# Patient Record
Sex: Female | Born: 1960 | Race: White | Hispanic: No | Marital: Married | Smoking: Never smoker
Health system: Southern US, Community
[De-identification: ages and names within clinical notes are randomized; demographics above are authoritative.]

## PROBLEM LIST (undated history)

## (undated) DIAGNOSIS — E079 Disorder of thyroid, unspecified: Secondary | ICD-10-CM

## (undated) DIAGNOSIS — N12 Tubulo-interstitial nephritis, not specified as acute or chronic: Secondary | ICD-10-CM

## (undated) DIAGNOSIS — L659 Nonscarring hair loss, unspecified: Secondary | ICD-10-CM

## (undated) DIAGNOSIS — E349 Endocrine disorder, unspecified: Secondary | ICD-10-CM

## (undated) DIAGNOSIS — Z1371 Encounter for nonprocreative screening for genetic disease carrier status: Secondary | ICD-10-CM

## (undated) DIAGNOSIS — D219 Benign neoplasm of connective and other soft tissue, unspecified: Secondary | ICD-10-CM

## (undated) DIAGNOSIS — K589 Irritable bowel syndrome without diarrhea: Secondary | ICD-10-CM

## (undated) DIAGNOSIS — N912 Amenorrhea, unspecified: Secondary | ICD-10-CM

## (undated) DIAGNOSIS — F419 Anxiety disorder, unspecified: Secondary | ICD-10-CM

## (undated) HISTORY — DX: Benign neoplasm of connective and other soft tissue, unspecified: D21.9

## (undated) HISTORY — DX: Anxiety disorder, unspecified: F41.9

## (undated) HISTORY — PX: OPEN REDUCTION INTERNAL FIXATION (ORIF) CALCANEAL FRACTURE WITH FUSION: SHX5697

## (undated) HISTORY — DX: Irritable bowel syndrome, unspecified: K58.9

## (undated) HISTORY — DX: Amenorrhea, unspecified: N91.2

## (undated) HISTORY — DX: Disorder of thyroid, unspecified: E07.9

## (undated) HISTORY — DX: Endocrine disorder, unspecified: E34.9

## (undated) HISTORY — DX: Nonscarring hair loss, unspecified: L65.9

## (undated) HISTORY — DX: Tubulo-interstitial nephritis, not specified as acute or chronic: N12

## (undated) HISTORY — DX: Encounter for nonprocreative screening for genetic disease carrier status: Z13.71

---

## 1998-08-25 ENCOUNTER — Inpatient Hospital Stay (HOSPITAL_COMMUNITY): Admission: AD | Admit: 1998-08-25 | Discharge: 1998-08-29 | Payer: Self-pay | Admitting: *Deleted

## 1999-01-17 ENCOUNTER — Other Ambulatory Visit: Admission: RE | Admit: 1999-01-17 | Discharge: 1999-01-17 | Payer: Self-pay | Admitting: *Deleted

## 2000-04-13 ENCOUNTER — Encounter: Payer: Self-pay | Admitting: *Deleted

## 2000-04-13 ENCOUNTER — Ambulatory Visit (HOSPITAL_COMMUNITY): Admission: RE | Admit: 2000-04-13 | Discharge: 2000-04-13 | Payer: Self-pay | Admitting: *Deleted

## 2000-09-14 ENCOUNTER — Inpatient Hospital Stay (HOSPITAL_COMMUNITY): Admission: AD | Admit: 2000-09-14 | Discharge: 2000-09-17 | Payer: Self-pay | Admitting: *Deleted

## 2003-01-26 ENCOUNTER — Other Ambulatory Visit: Admission: RE | Admit: 2003-01-26 | Discharge: 2003-01-26 | Payer: Self-pay | Admitting: Obstetrics and Gynecology

## 2005-10-26 ENCOUNTER — Emergency Department: Payer: Self-pay | Admitting: Emergency Medicine

## 2006-04-23 ENCOUNTER — Emergency Department: Payer: Self-pay | Admitting: Unknown Physician Specialty

## 2006-04-23 ENCOUNTER — Other Ambulatory Visit: Payer: Self-pay

## 2006-04-24 ENCOUNTER — Ambulatory Visit: Payer: Self-pay | Admitting: Internal Medicine

## 2009-02-05 ENCOUNTER — Emergency Department: Payer: Self-pay | Admitting: Emergency Medicine

## 2010-05-31 ENCOUNTER — Ambulatory Visit: Payer: Self-pay | Admitting: Family Medicine

## 2010-06-01 ENCOUNTER — Inpatient Hospital Stay: Payer: Self-pay | Admitting: Internal Medicine

## 2011-12-26 HISTORY — PX: COLONOSCOPY: SHX174

## 2012-05-24 ENCOUNTER — Ambulatory Visit: Payer: Self-pay | Admitting: Unknown Physician Specialty

## 2012-12-25 DIAGNOSIS — Z1371 Encounter for nonprocreative screening for genetic disease carrier status: Secondary | ICD-10-CM

## 2012-12-25 HISTORY — DX: Encounter for nonprocreative screening for genetic disease carrier status: Z13.71

## 2013-01-09 ENCOUNTER — Ambulatory Visit: Payer: Self-pay | Admitting: Family Medicine

## 2013-01-13 ENCOUNTER — Ambulatory Visit: Payer: Self-pay | Admitting: Family Medicine

## 2013-03-06 IMAGING — US THYROID ULTRASOUND
1 series · 14 of 25 positions shown · non-contrast
Comparison: none

REASON FOR EXAM: rt sided fullness hyperthyroidism
COMMENTS:

[Series 1: thyroid ultrasound · 0.07mm/px · 14 of 45 slices shown]
[im 1/45]
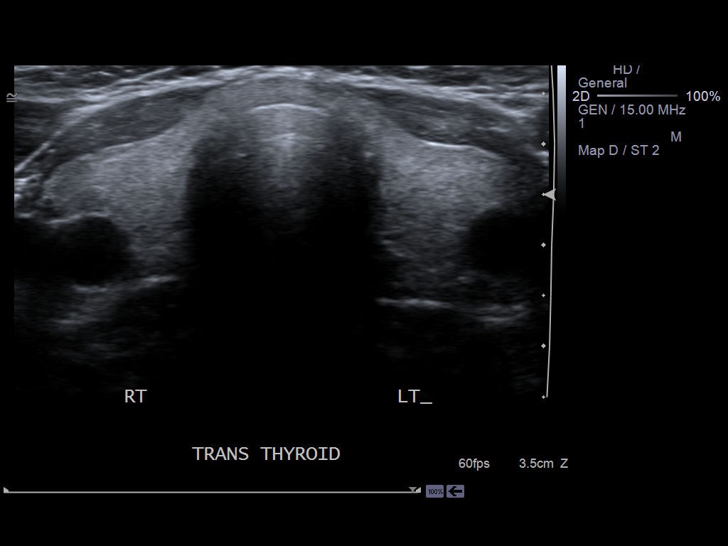
[im 4/45]
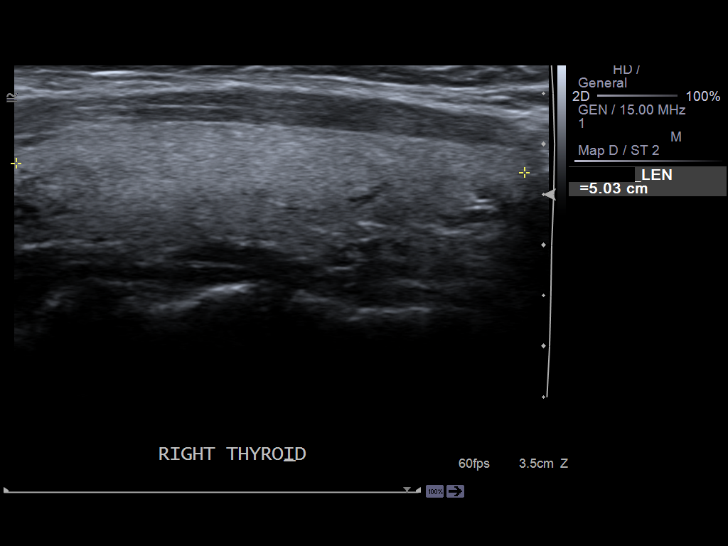
[im 8/45]
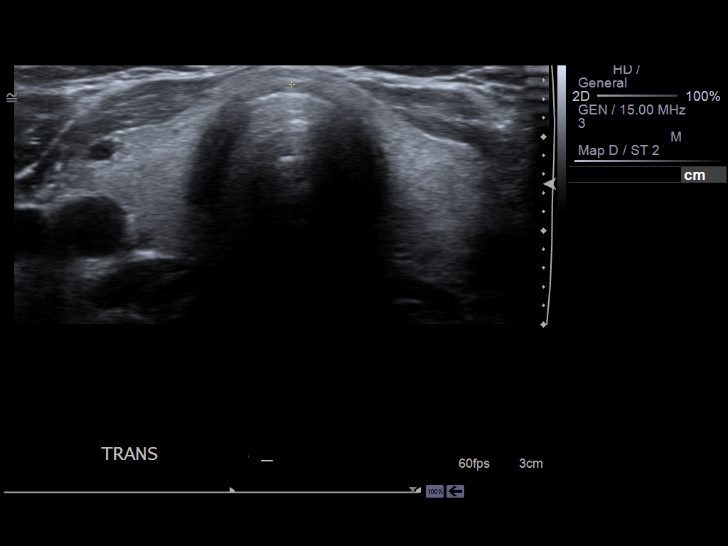
[im 12/45]
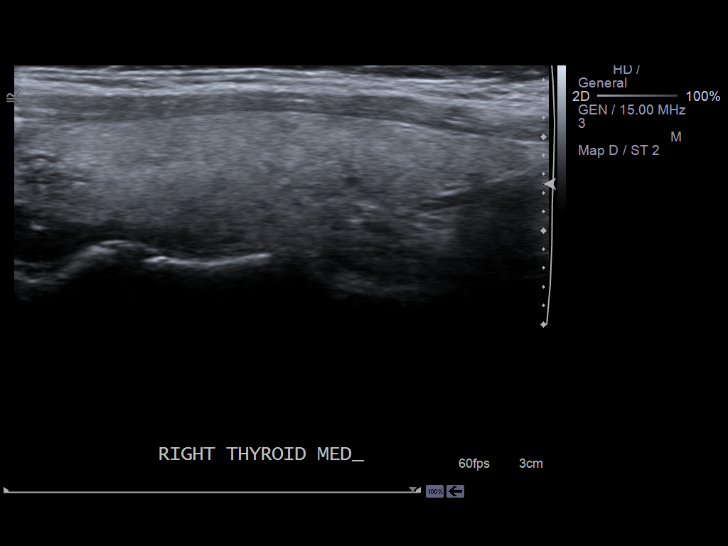
[im 15/45]
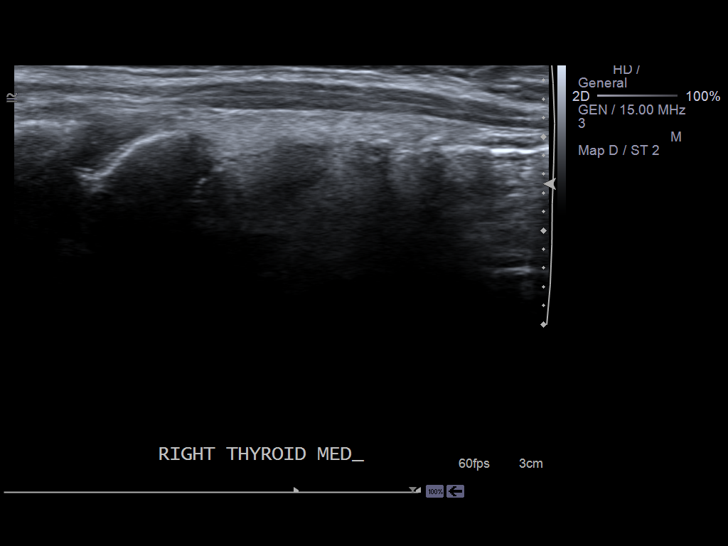
[im 17/45]
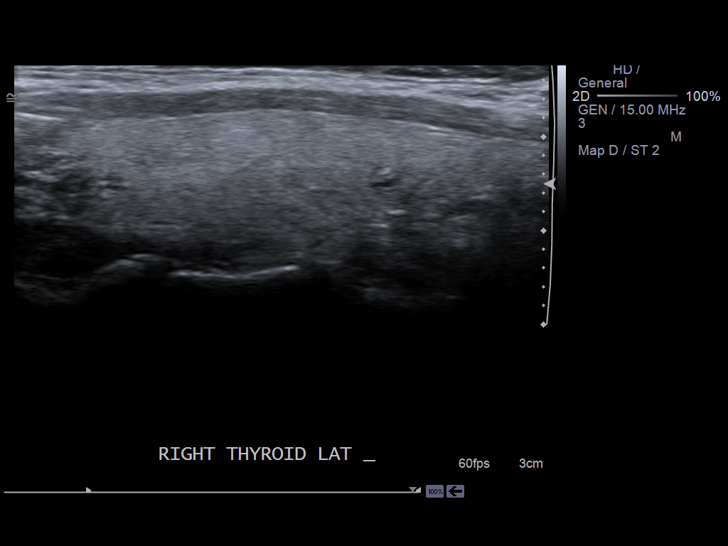
[im 21/45]
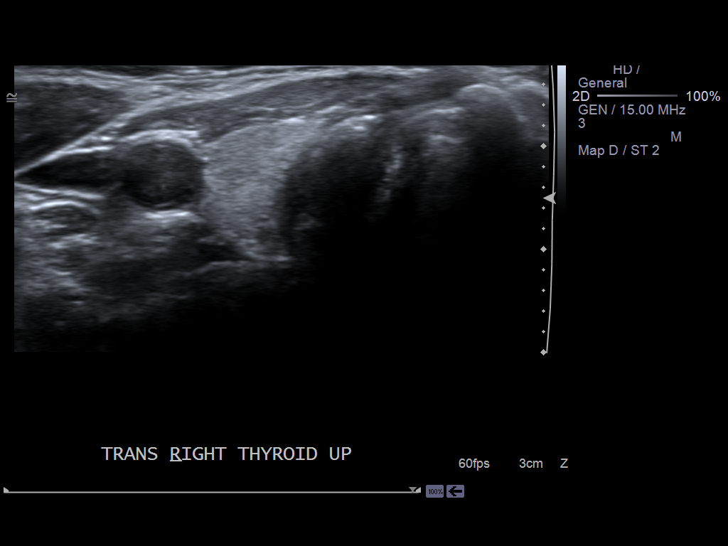
[im 24/45]
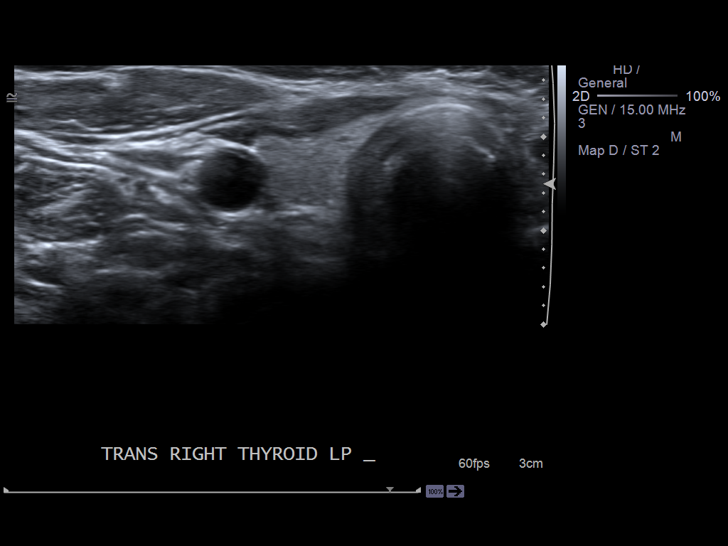
[im 28/45]
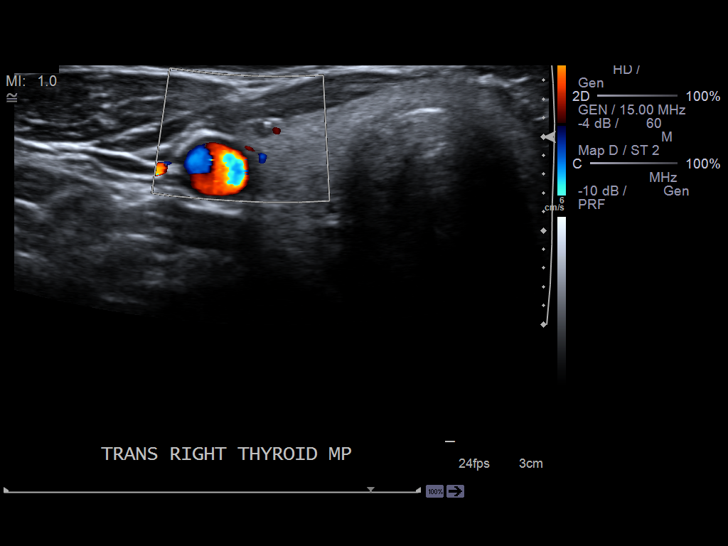
[im 30/45]
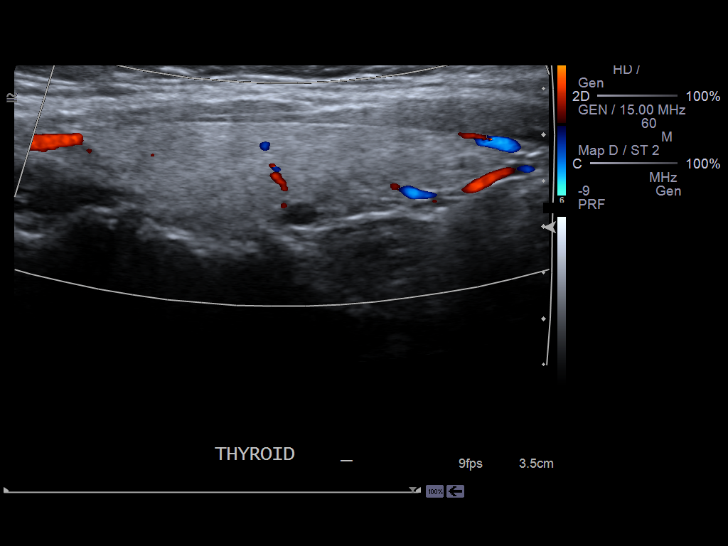
[im 34/45]
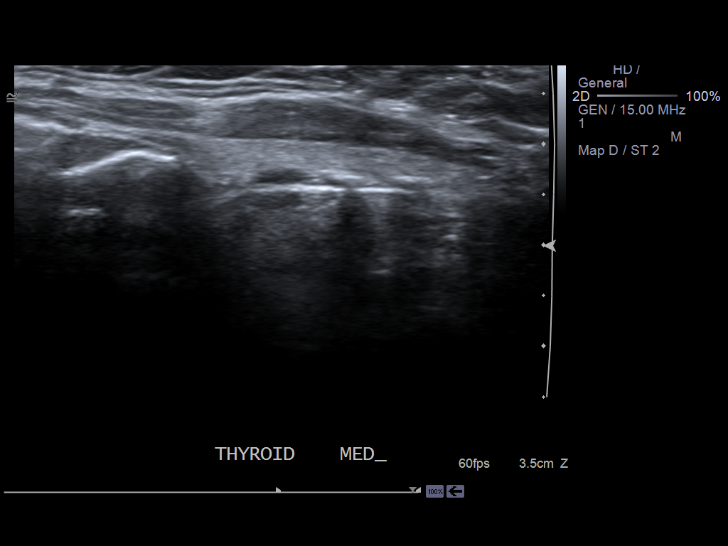
[im 37/45]
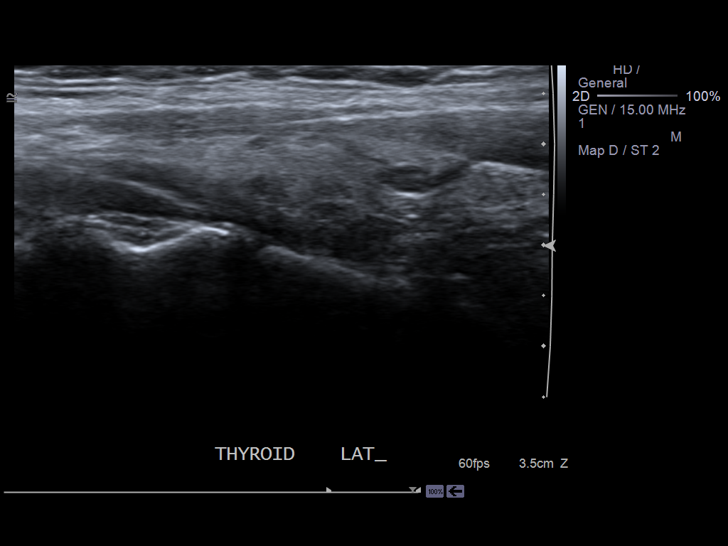
[im 41/45]
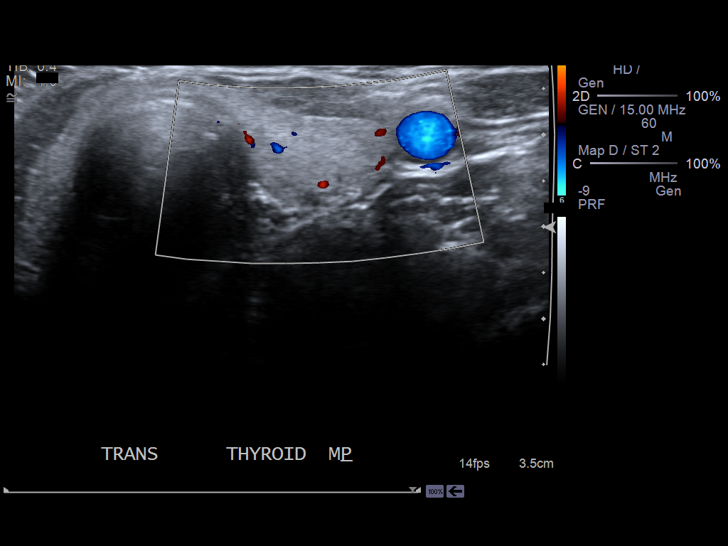
[im 45/45]
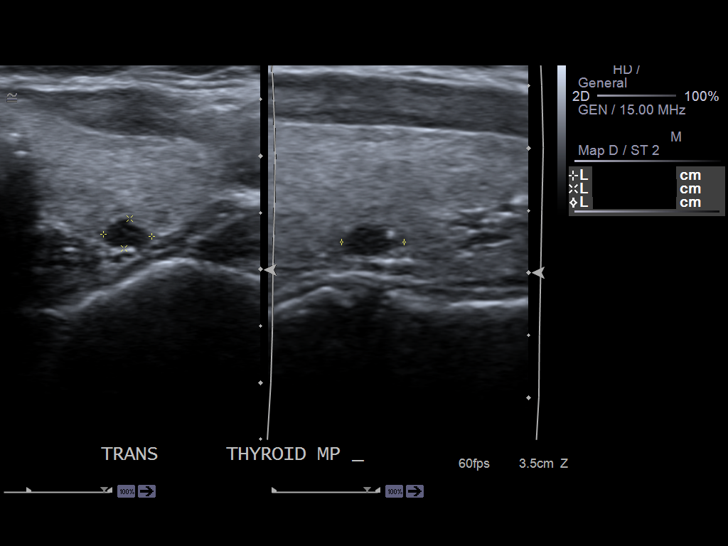

[14 of 25 positions shown; findings below may reference images not displayed]

PROCEDURE:     DARIUS THEODORE - DARIUS THEODORE SOFT TISSUE HEAD/NECK/THYROI  - January 13, 2013  [DATE]

RESULT:     The right thyroid lobe measures 5.03 x 1.32 x 1.31 cm. The left
thyroid lobe measures 4.64 x 1.54 x 1.22 cm. The isthmus shows an anterior
to posterior thickness of 0.24 cm. Within the mid right thyroid lobe there
is a hypoechoic solid appearing mass of 0.5 x 0.34 x 0.2 or centimeters.
Within the left thyroid lobe midpole region posteriorly there is a
hypoechoic 0.43 x 0.27 x 0.50 cm nodule. The hypo and anechoic structures
show some focal hyper echogenicity suggestive of areas of minimal
calcification.
IMPRESSION: Hypoechoic lesions with calcification in both thyroid
lobes. Borderline thyroid enlargement on the right.

[REDACTED]

## 2014-08-26 ENCOUNTER — Emergency Department: Payer: Self-pay | Admitting: Emergency Medicine

## 2014-08-26 LAB — CBC WITH DIFFERENTIAL/PLATELET
Basophil #: 0 10*3/uL (ref 0.0–0.1)
Basophil %: 0.4 %
Eosinophil #: 0 10*3/uL (ref 0.0–0.7)
Eosinophil %: 0.2 %
HCT: 40.3 % (ref 35.0–47.0)
HGB: 13.2 g/dL (ref 12.0–16.0)
Lymphocyte #: 0.9 10*3/uL — ABNORMAL LOW (ref 1.0–3.6)
Lymphocyte %: 13.2 %
MCH: 31.3 pg (ref 26.0–34.0)
MCHC: 32.7 g/dL (ref 32.0–36.0)
MCV: 96 fL (ref 80–100)
Monocyte #: 0.3 x10 3/mm (ref 0.2–0.9)
Monocyte %: 4.3 %
Neutrophil #: 5.9 10*3/uL (ref 1.4–6.5)
Neutrophil %: 81.9 %
Platelet: 215 10*3/uL (ref 150–440)
RBC: 4.22 10*6/uL (ref 3.80–5.20)
RDW: 12.2 % (ref 11.5–14.5)
WBC: 7.2 10*3/uL (ref 3.6–11.0)

## 2014-08-26 LAB — BASIC METABOLIC PANEL
Anion Gap: 6 — ABNORMAL LOW (ref 7–16)
BUN: 11 mg/dL (ref 7–18)
Calcium, Total: 8.7 mg/dL (ref 8.5–10.1)
Chloride: 105 mmol/L (ref 98–107)
Co2: 27 mmol/L (ref 21–32)
Creatinine: 0.7 mg/dL (ref 0.60–1.30)
EGFR (African American): >60
EGFR (Non-African Amer.): >60
Glucose: 136 mg/dL — ABNORMAL HIGH (ref 65–99)
Osmolality: 277 (ref 275–301)
Potassium: 3.8 mmol/L (ref 3.5–5.1)
Sodium: 138 mmol/L (ref 136–145)

## 2014-08-26 LAB — URINALYSIS, COMPLETE
Bacteria: NONE SEEN
Bilirubin,UR: NEGATIVE
Blood: NEGATIVE
Glucose,UR: NEGATIVE mg/dL (ref 0–75)
Ketone: NEGATIVE
Leukocyte Esterase: NEGATIVE
Nitrite: NEGATIVE
Ph: 6 (ref 4.5–8.0)
Protein: NEGATIVE
RBC,UR: NONE SEEN /HPF (ref 0–5)
Specific Gravity: 1.005 (ref 1.003–1.030)
Squamous Epithelial: <1
WBC UR: <1 /HPF (ref 0–5)

## 2014-08-26 LAB — T4, FREE: Free Thyroxine: 0.99 ng/dL (ref 0.76–1.46)

## 2014-08-26 LAB — TROPONIN I: Troponin-I: <0.02 ng/mL

## 2014-12-28 DIAGNOSIS — E039 Hypothyroidism, unspecified: Secondary | ICD-10-CM | POA: Insufficient documentation

## 2015-12-31 DIAGNOSIS — E038 Other specified hypothyroidism: Secondary | ICD-10-CM | POA: Insufficient documentation

## 2015-12-31 DIAGNOSIS — E063 Autoimmune thyroiditis: Secondary | ICD-10-CM | POA: Insufficient documentation

## 2015-12-31 DIAGNOSIS — E042 Nontoxic multinodular goiter: Secondary | ICD-10-CM | POA: Insufficient documentation

## 2016-03-05 ENCOUNTER — Emergency Department
Admission: EM | Admit: 2016-03-05 | Discharge: 2016-03-05 | Disposition: A | Discharge status: Home / Self Care | Payer: BLUE CROSS/BLUE SHIELD | Attending: Emergency Medicine | Admitting: Emergency Medicine

## 2016-03-05 DIAGNOSIS — R509 Fever, unspecified: Secondary | ICD-10-CM | POA: Diagnosis present

## 2016-03-05 DIAGNOSIS — B349 Viral infection, unspecified: Secondary | ICD-10-CM | POA: Insufficient documentation

## 2016-03-05 MED ORDER — ACETAMINOPHEN-CODEINE 120-12 MG/5ML PO SOLN: 2.5000 mL | Freq: Once | ORAL | Status: DC

## 2016-03-05 MED ORDER — SULFAMETHOXAZOLE-TRIMETHOPRIM 200-40 MG/5ML PO SUSP: 5.0000 mL | Freq: Once | ORAL | Status: DC

## 2016-03-05 MED ORDER — PSEUDOEPH-BROMPHEN-DM 30-2-10 MG/5ML PO SYRP
5.0000 mL | ORAL_SOLUTION | Freq: Four times a day (QID) | ORAL | Status: DC | PRN
Start: 2016-03-05 — End: 2018-06-13

## 2016-03-05 NOTE — Discharge Instructions (Signed)
Continue taking Tylenol for fever and body aches. Viral Infections A virus is a type of germ. Viruses can cause:  Minor sore throats.  Aches and pains.  Headaches.  Runny nose.  Rashes.  Watery eyes.  Tiredness.  Coughs.  Loss of appetite.  Feeling sick to your stomach (nausea).  Throwing up (vomiting).  Watery poop (diarrhea). HOME CARE   Only take medicines as told by your doctor.  Drink enough water and fluids to keep your pee (urine) clear or pale yellow. Sports drinks are a good choice.  Get plenty of rest and eat healthy. Soups and broths with crackers or rice are fine. GET HELP RIGHT AWAY IF:   You have a very bad headache.  You have shortness of breath.  You have chest pain or neck pain.  You have an unusual rash.  You cannot stop throwing up.  You have watery poop that does not stop.  You cannot keep fluids down.  You or your child has a temperature by mouth above 102 F (38.9 C), not controlled by medicine.  Your baby is older than 3 months with a rectal temperature of 102 F (38.9 C) or higher.  Your baby is 63 months old or younger with a rectal temperature of 100.4 F (38 C) or higher. MAKE SURE YOU:   Understand these instructions.  Will watch this condition.  Will get help right away if you are not doing well or get worse.   This information is not intended to replace advice given to you by your health care provider. Make sure you discuss any questions you have with your health care provider.   Document Released: 11/23/2008 Document Revised: 03/04/2012 Document Reviewed: 05/19/2015 Elsevier Interactive Patient Education [employer].

## 2016-03-05 NOTE — ED Notes (Signed)
Believes may have flu d/t feeling of "creepy crawly skin", chills, muscle pain in neck, pain in eyes. Nonfebrile at this time, with stable vs

## 2016-03-05 NOTE — ED Notes (Signed)
Pt states that she has had a fever the past few days, states temp increased today, states that her skin feels "creepy crawly", pt reports eye pain. Denies dysuria, reports sepsis in the past and wanted to make sure she was evaluated before she became that sick

## 2016-03-05 NOTE — ED Provider Notes (Signed)
[facility] Emergency Department Provider Note  ____________________________________________  Time seen: Approximately 10:45 PM  I have reviewed the triage vital signs and the nursing notes.   HISTORY  Chief Complaint Fever    HPI Kelly Ortiz is a 55 y.o. female patient believes she has the flu. Patient states she's having creepy crawly skin, chills, muscle aches, and pain behind her eyes. Patient state this complaint for the last 5 days. Patient states she was being evaluated because this was same symptoms she had in the past which she became septic and had hospital admission. Patient states she's had the flu shot this season. Patient denies any nausea vomiting diarrhea. Patient state taking Tylenol for body aches. Patient rates the pain as a 5/10. Patient describes the pain as "achy".   No past medical history on file.  There are no active problems to display for this patient.   No past surgical history on file.  Current Outpatient Rx  Name  Route  Sig  Dispense  Refill  . brompheniramine-pseudoephedrine-DM 30-2-10 MG/5ML syrup   Oral   Take 5 mLs by mouth 4 (four) times daily as needed.   120 mL   0     Allergies Iodine  No family history on file.  Social History Social History  Substance Use Topics  . Smoking status: Not on file  . Smokeless tobacco: Not on file  . Alcohol Use: Not on file    Review of Systems Constitutional: States fevers chills Eyes: No visual changes. ENT: No sore throat. Cardiovascular: Denies chest pain. Respiratory: Denies shortness of breath. Gastrointestinal: No abdominal pain.  No nausea, no vomiting.  No diarrhea.  No constipation. Genitourinary: Negative for dysuria. Musculoskeletal: Negative for back pain. Skin: Negative for rash. Neurological: Negative for headaches, focal weakness or numbness. 10-point ROS otherwise negative.  ____________________________________________   PHYSICAL  EXAM:  VITAL SIGNS: ED Triage Vitals  Enc Vitals Group     BP 03/05/16 2154 142/67 mmHg     Pulse Rate 03/05/16 2154 100     Resp 03/05/16 2154 18     Temp 03/05/16 2154 99.2 F (37.3 C)     Temp Source 03/05/16 2154 Oral     SpO2 03/05/16 2154 99 %     Weight 03/05/16 2154 129 lb (58.514 kg)     Height 03/05/16 2154 5\' 3"  (1.6 m)     Head Cir --      Peak Flow --      Pain Score 03/05/16 2155 5     Pain Loc --      Pain Edu? --      Excl. in [place]? --     Constitutional: Alert and oriented. Well appearing and in no acute distress. Eyes: Conjunctivae are normal. PERRL. EOMI. Head: Atraumatic. Nose:  congestion/rhinnorhea. Mouth/Throat: Mucous membranes are moist.  Oropharynx non-erythematous. Neck: No stridor.  No cervical spine tenderness to palpation. Hematological/Lymphatic/Immunilogical: No cervical lymphadenopathy. Cardiovascular: Normal rate, regular rhythm. Grossly normal heart sounds.  Good peripheral circulation. Respiratory: Normal respiratory effort.  No retractions. Lungs CTAB. Nonproductive cough Gastrointestinal: Soft and nontender. No distention. No abdominal bruits. No CVA tenderness. Musculoskeletal: No lower extremity tenderness nor edema.  No joint effusions. Neurologic:  Normal speech and language. No gross focal neurologic deficits are appreciated. No gait instability. Skin:  Skin is warm, dry and intact. No rash noted. Psychiatric: Mood and affect are normal. Speech and behavior are normal.  ____________________________________________   LABS (all labs ordered are  listed, but only abnormal results are displayed)  Labs Reviewed - No data to display ____________________________________________  EKG   ____________________________________________  RADIOLOGY   ____________________________________________   PROCEDURES  Procedure(s) performed: None  Critical Care performed: No  ____________________________________________   INITIAL  IMPRESSION / ASSESSMENT AND PLAN / ED COURSE  Pertinent labs & imaging results that were available during my care of the patient were reviewed by me and considered in my medical decision making (see chart for details).  Viral illness. Patient given discharge care instructions. Patient advised to follow-up with family doctor if no improvement in next 2-3 days. Patient given a prescription for Bromfed-DM and advised to continue Tylenol for pain. ____________________________________________   FINAL CLINICAL IMPRESSION(S) / ED DIAGNOSES  Final diagnoses:  Viral illness      Sable Feil, PA-C 03/05/16 2258  Delman Kitten, MD 03/06/16 [phone]

## 2017-05-04 ENCOUNTER — Ambulatory Visit: Payer: Self-pay | Admitting: Obstetrics and Gynecology

## 2017-05-09 ENCOUNTER — Ambulatory Visit (INDEPENDENT_AMBULATORY_CARE_PROVIDER_SITE_OTHER): Payer: BLUE CROSS/BLUE SHIELD | Admitting: Obstetrics and Gynecology

## 2017-05-09 ENCOUNTER — Encounter: Payer: Self-pay | Admitting: Obstetrics and Gynecology

## 2017-05-09 VITALS — BP 102/58 | HR 72 | Ht 63.0 in | Wt 136.0 lb

## 2017-05-09 DIAGNOSIS — Z01419 Encounter for gynecological examination (general) (routine) without abnormal findings: Secondary | ICD-10-CM | POA: Diagnosis not present

## 2017-05-09 DIAGNOSIS — Z1239 Encounter for other screening for malignant neoplasm of breast: Secondary | ICD-10-CM

## 2017-05-09 DIAGNOSIS — Z1231 Encounter for screening mammogram for malignant neoplasm of breast: Secondary | ICD-10-CM | POA: Diagnosis not present

## 2017-05-09 DIAGNOSIS — Z79890 Hormone replacement therapy (postmenopausal): Secondary | ICD-10-CM | POA: Diagnosis not present

## 2017-05-09 DIAGNOSIS — Z8041 Family history of malignant neoplasm of ovary: Secondary | ICD-10-CM

## 2017-05-09 MED ORDER — AMBULATORY NON FORMULARY MEDICATION: 1 refills | Status: DC

## 2017-05-09 MED ORDER — AMBULATORY NON FORMULARY MEDICATION: 12 refills | Status: DC

## 2017-05-09 NOTE — Progress Notes (Signed)
Chief Complaint  Patient presents with  . Gynecologic Exam    HPI:      [name] is a 56 y.o. No obstetric history on file. who LMP was No LMP recorded. Patient is postmenopausal., presents today for her annual examination.  Her menses are absent due to menopause. She does not have intermenstrual bleeding.  She does have occas vasomotor sx. She uses estradiol and progesterone with sx relief. She is on biest 3% 70/30 crm and prog 150 mg. She denies any side effects and wants to cont meds. Rx RF go to [place].  Sex activity: single partner, contraception - post menopausal status. She does not have vaginal dryness.  Last Pap: Apr 29, 2015  Results were: no abnormalities /neg HPV DNA.  Hx of STDs: none  Last mammogram: September 04, 2016  Results were: normal--routine follow-up in 12 months There is no FH of breast cancer. There is a FH of ovarian cancer in her mom. Pt was BRCA neg 2014 and My Risk neg 2017. Pt does yearly GYN u/s and ca-125. The patient does do self-breast exams.  Colonoscopy: colonoscopy 5 years ago without abnormalities. Repeat--10 yrs. DEXA with PCP 2017 was T-1.5 in spine.   Tobacco use: None Alcohol use: social drinker Exercise: moderately active  She does get adequate calcium and Vitamin D in her diet.  She has labs with PCP.  Past Medical History:  Diagnosis Date  . Alopecia   . Amenorrhea   . Anxiety   . BRCA negative   . Fibroid   . Hormone disorder   . IBS (irritable bowel syndrome)   . Pyelonephritis   . Thyroid disease     Past Surgical History:  Procedure Laterality Date  . CESAREAN SECTION    . COLONOSCOPY  2013  . OPEN REDUCTION INTERNAL FIXATION (ORIF) CALCANEAL FRACTURE WITH FUSION      Family History  Problem Relation Age of Onset  . Rheum arthritis Mother   . Osteoporosis Mother   . Ovarian cancer Mother   . Cancer Mother 73       ovarian/uterine   . Coronary artery disease Father   . Diabetes Father   .  Coronary artery disease Maternal Grandfather   . Hyperlipidemia Maternal Grandfather   . Coronary artery disease Paternal Grandmother   . Diabetes Paternal Grandmother   . Heart failure Paternal Grandfather     Social History   Social History  . Marital status: Married    Spouse name: N/A  . Number of children: N/A  . Years of education: N/A   Occupational History  . Not on file.   Social History Main Topics  . Smoking status: Never Smoker  . Smokeless tobacco: Never Used  . Alcohol use Yes     Comment: occaisonal  . Drug use: No  . Sexual activity: Yes    Birth control/ protection: Post-menopausal   Other Topics Concern  . Not on file   Social History Narrative  . No narrative on file     Current Outpatient Prescriptions:  .  Thyroid (LEVOTHYROXINE-LIOTHYRONINE) 60 MG TABS, Take by mouth., Disp: , Rfl:  .  AMBULATORY NON FORMULARY MEDICATION, Progesterone oral 150 mg Take 1 capsule daily 6 days on, 1 day off, Disp: 30 capsule, Rfl: 12 .  AMBULATORY NON FORMULARY MEDICATION, Biest 3% 70/30 cream Apply 0.1 ml daily, dispense 5 ml syringes, Disp: 30 mL, Rfl: 1 .  aspirin EC 81 MG tablet, Take by mouth.,  Disp: , Rfl:  .  B Complex Vitamins (VITAMIN-B COMPLEX) TABS, Take by mouth., Disp: , Rfl:  .  Biotin 2500 MCG CAPS, Take by mouth., Disp: , Rfl:  .  brompheniramine-pseudoephedrine-DM 30-2-10 MG/5ML syrup, Take 5 mLs by mouth 4 (four) times daily as needed., Disp: 120 mL, Rfl: 0 .  calcium citrate-vitamin D (CITRACAL+D) 315-200 MG-UNIT tablet, Take by mouth., Disp: , Rfl:  .  Cholecalciferol (VITAMIN D) 2000 units tablet, Take by mouth., Disp: , Rfl:  .  Garlic Oil (HM GARLIC) 448 MG TABS, Take by mouth., Disp: , Rfl:  .  GLUCOSAMINE-CHONDROITIN-CA-D3 PO, Take by mouth., Disp: , Rfl:  .  MAGNESIUM PO, Take by mouth., Disp: , Rfl:  .  Multiple Vitamin (MULTI-VITAMINS) TABS, Take by mouth., Disp: , Rfl:  .  Probiotic Product (PROBIOTIC-10 PO), Take by mouth., Disp: , Rfl:   .  Tavaborole 5 % SOLN, Apply topically once daily., Disp: , Rfl:  .  TURMERIC PO, Take by mouth., Disp: , Rfl:  .  vitamin C (ASCORBIC ACID) 500 MG tablet, Take by mouth., Disp: , Rfl:    ROS:  Review of Systems  Constitutional: Negative for fatigue, fever and unexpected weight change.  Respiratory: Negative for cough, shortness of breath and wheezing.   Cardiovascular: Negative for chest pain, palpitations and leg swelling.  Gastrointestinal: Positive for constipation. Negative for blood in stool, diarrhea, nausea and vomiting.  Endocrine: Negative for cold intolerance, heat intolerance and polyuria.  Genitourinary: Negative for dyspareunia, dysuria, flank pain, frequency, genital sores, hematuria, menstrual problem, pelvic pain, urgency, vaginal bleeding, vaginal discharge and vaginal pain.  Musculoskeletal: Negative for back pain, joint swelling and myalgias.  Skin: Negative for rash.  Neurological: Negative for dizziness, syncope, light-headedness, numbness and headaches.  Hematological: Negative for adenopathy.  Psychiatric/Behavioral: Negative for agitation, confusion, sleep disturbance and suicidal ideas. The patient is nervous/anxious.      Objective: BP (!) 102/58   Pulse 72   Ht 5' 3"  (1.6 m)   Wt 136 lb (61.7 kg)   BMI 24.09 kg/m    Physical Exam  Constitutional: She is oriented to person, place, and time. She appears well-developed and well-nourished.  Genitourinary: Vagina normal and uterus normal. There is no rash or tenderness on the right labia. There is no rash or tenderness on the left labia. No erythema or tenderness in the vagina. No vaginal discharge found. Right adnexum does not display mass and does not display tenderness. Left adnexum does not display mass and does not display tenderness. Cervix does not exhibit motion tenderness or polyp. Uterus is not enlarged or tender.  Neck: Normal range of motion. No thyromegaly present.  Cardiovascular: Normal  rate, regular rhythm and normal heart sounds.   No murmur heard. Pulmonary/Chest: Effort normal and breath sounds normal. Right breast exhibits no mass, no nipple discharge, no skin change and no tenderness. Left breast exhibits no mass, no nipple discharge, no skin change and no tenderness.  Abdominal: Soft. There is no tenderness. There is no guarding.  Musculoskeletal: Normal range of motion.  Neurological: She is alert and oriented to person, place, and time. No cranial nerve deficit.  Psychiatric: She has a normal mood and affect. Her behavior is normal.  Vitals reviewed.    Assessment/Plan: Encounter for annual routine gynecological examination  Screening for breast cancer - Pt has mammo sched.  - Plan: MM DIGITAL SCREENING BILATERAL  Family history of ovarian cancer - Pt is BRCA/My Risk negative. Pt does yearly GYN  u/s and ca-125. - Plan: US Transvaginal Non-OB, CA 125  Hormone replacement therapy (HRT) - Plan: AMBULATORY NON FORMULARY MEDICATION, AMBULATORY NON FORMULARY MEDICATION, DISCONTINUED: AMBULATORY NON FORMULARY MEDICATION Biest 3% 70/30 crm, 0.1 ml daily Prog 150 mg SR caps, 6 nights on, 1 night off         GYN counsel mammography screening, use and side effects of HRT, menopause, adequate intake of calcium and vitamin D, diet and exercise     F/U  Return in about 1 year (around 05/09/2018) for GYN u/s and ca-125  6/18.  Amoria Mclees B. Illana Nolting, PA-C 05/09/2017 4:13 PM

## 2017-07-18 DIAGNOSIS — Z79890 Hormone replacement therapy (postmenopausal): Secondary | ICD-10-CM

## 2017-07-18 MED ORDER — AMBULATORY NON FORMULARY MEDICATION: 12 refills | Status: DC

## 2017-09-03 ENCOUNTER — Encounter: Payer: Self-pay | Admitting: Obstetrics and Gynecology

## 2017-09-04 ENCOUNTER — Encounter: Payer: Self-pay | Admitting: Obstetrics and Gynecology

## 2017-09-28 NOTE — Telephone Encounter (Signed)
This encounter was created in error - please disregard.

## 2018-01-18 ENCOUNTER — Telehealth: Payer: Self-pay

## 2018-01-18 NOTE — Telephone Encounter (Signed)
Pt needs rx refill for compounded progesterone 150 mg and biest 3%. Previously used [facility] and pt now needs new rx's sent to [facility] in [place]. She only needs the progesterone right now. Please make a note on biest rx to put on HOLD. Pt aware [facility] out of the office today, she has enough for a few days. Cb# [id number].   Thank you!

## 2018-01-21 ENCOUNTER — Other Ambulatory Visit: Payer: Self-pay | Admitting: Obstetrics and Gynecology

## 2018-01-21 DIAGNOSIS — Z79890 Hormone replacement therapy (postmenopausal): Secondary | ICD-10-CM

## 2018-01-21 MED ORDER — AMBULATORY NON FORMULARY MEDICATION: 1 refills | Status: DC

## 2018-01-21 MED ORDER — AMBULATORY NON FORMULARY MEDICATION: 3 refills | Status: DC

## 2018-01-21 NOTE — Telephone Encounter (Signed)
Pt aware by detailed vm. 

## 2018-01-21 NOTE — Progress Notes (Signed)
Rx change to [facility] from [facility].

## 2018-01-21 NOTE — Telephone Encounter (Signed)
Faxed to [facility] this AM.

## 2018-01-21 NOTE — Telephone Encounter (Signed)
I did. Thx.

## 2018-01-21 NOTE — Telephone Encounter (Signed)
Pls let pt know Rxs sent.

## 2018-04-12 ENCOUNTER — Encounter: Payer: Self-pay | Admitting: Obstetrics and Gynecology

## 2018-06-13 ENCOUNTER — Encounter: Payer: Self-pay | Admitting: Obstetrics and Gynecology

## 2018-06-13 ENCOUNTER — Ambulatory Visit (INDEPENDENT_AMBULATORY_CARE_PROVIDER_SITE_OTHER): Payer: BLUE CROSS/BLUE SHIELD | Admitting: Obstetrics and Gynecology

## 2018-06-13 VITALS — BP 102/68 | HR 67 | Ht 62.5 in | Wt 127.0 lb

## 2018-06-13 DIAGNOSIS — F419 Anxiety disorder, unspecified: Secondary | ICD-10-CM | POA: Insufficient documentation

## 2018-06-13 DIAGNOSIS — Z01419 Encounter for gynecological examination (general) (routine) without abnormal findings: Secondary | ICD-10-CM | POA: Diagnosis not present

## 2018-06-13 DIAGNOSIS — Z124 Encounter for screening for malignant neoplasm of cervix: Secondary | ICD-10-CM

## 2018-06-13 DIAGNOSIS — Z1239 Encounter for other screening for malignant neoplasm of breast: Secondary | ICD-10-CM

## 2018-06-13 DIAGNOSIS — Z8041 Family history of malignant neoplasm of ovary: Secondary | ICD-10-CM

## 2018-06-13 DIAGNOSIS — J309 Allergic rhinitis, unspecified: Secondary | ICD-10-CM | POA: Insufficient documentation

## 2018-06-13 DIAGNOSIS — M1990 Unspecified osteoarthritis, unspecified site: Secondary | ICD-10-CM | POA: Insufficient documentation

## 2018-06-13 DIAGNOSIS — Z1231 Encounter for screening mammogram for malignant neoplasm of breast: Secondary | ICD-10-CM | POA: Diagnosis not present

## 2018-06-13 DIAGNOSIS — Z79890 Hormone replacement therapy (postmenopausal): Secondary | ICD-10-CM | POA: Diagnosis not present

## 2018-06-13 DIAGNOSIS — Z1151 Encounter for screening for human papillomavirus (HPV): Secondary | ICD-10-CM | POA: Diagnosis not present

## 2018-06-13 MED ORDER — AMBULATORY NON FORMULARY MEDICATION: 3 refills | Status: DC

## 2018-06-13 MED ORDER — AMBULATORY NON FORMULARY MEDICATION: 1 refills | Status: DC

## 2018-06-13 NOTE — Patient Instructions (Signed)
I value your feedback and entrusting us with your care. If you get a [facility] patient survey, I would appreciate you taking the time to let us know about your experience today. Thank you! 

## 2018-06-13 NOTE — Progress Notes (Signed)
Chief Complaint  Patient presents with  . Gynecologic Exam     HPI:      [name] is a 57 y.o. [phone] who LMP was No LMP recorded. Patient is postmenopausal., presents today for her annual examination.   Her menses are absent due to menopause. She does not have intermenstrual bleeding.  She has minimal vasomotor sx. She uses estradiol and progesterone with sx relief. She is on biest 3% 70/30 crm and prog 150 mg. She denies any side effects and wants to cont meds. Willing to wean down on ERT. Rx RF go to [facility].  Sex activity: single partner, contraception - post menopausal status. She does not have vaginal dryness.  Last Pap: Apr 29, 2015  Results were: no abnormalities /neg HPV DNA.  Hx of STDs: none  Last mammogram: September 03, 2017 at [facility]. Results were: normal--routine follow-up in 12 months There is no FH of breast cancer. There is a FH of ovarian cancer in her mom. Pt was BRCA neg 2014 and My Risk neg 2017. Pt aware no great screening recommendations, but can have GYN u/s and ca-125. Has done in past and neg. The patient does do self-breast exams.  Colonoscopy: colonoscopy 6 years ago without abnormalities. Repeat--10 yrs. DEXA with PCP 2017 was T-1.5 in spine.   Tobacco use: None Alcohol use: social drinker Exercise: moderately active  She does get adequate calcium and Vitamin D in her diet.  She has labs with PCP.   Past Medical History:  Diagnosis Date  . Alopecia   . Amenorrhea   . Anxiety   . BRCA negative   . Fibroid   . Hormone disorder   . IBS (irritable bowel syndrome)   . Pyelonephritis   . Thyroid disease     Past Surgical History:  Procedure Laterality Date  . CESAREAN SECTION    . COLONOSCOPY  2013  . OPEN REDUCTION INTERNAL FIXATION (ORIF) CALCANEAL FRACTURE WITH FUSION      Family History  Problem Relation Age of Onset  . Rheum arthritis Mother   . Osteoporosis Mother   . Ovarian cancer Mother   . Cancer Mother  7       ovarian/uterine   . Coronary artery disease Father   . Diabetes Father   . Coronary artery disease Maternal Grandfather   . Hyperlipidemia Maternal Grandfather   . Coronary artery disease Paternal Grandmother   . Diabetes Paternal Grandmother   . Heart failure Paternal Grandfather     Social History   Socioeconomic History  . Marital status: Married    Spouse name: Not on file  . Number of children: Not on file  . Years of education: Not on file  . Highest education level: Not on file  Occupational History  . Not on file  Social Needs  . Financial resource strain: Not on file  . Food insecurity:    Worry: Not on file    Inability: Not on file  . Transportation needs:    Medical: Not on file    Non-medical: Not on file  Tobacco Use  . Smoking status: Never Smoker  . Smokeless tobacco: Never Used  Substance and Sexual Activity  . Alcohol use: Yes    Comment: occaisonal  . Drug use: No  . Sexual activity: Yes    Birth control/protection: Post-menopausal  Lifestyle  . Physical activity:    Days per week: Not on file    Minutes per session: Not on  file  . Stress: Not on file  Relationships  . Social connections:    Talks on phone: Not on file    Gets together: Not on file    Attends religious service: Not on file    Active member of club or organization: Not on file    Attends meetings of clubs or organizations: Not on file    Relationship status: Not on file  . Intimate partner violence:    Fear of current or ex partner: Not on file    Emotionally abused: Not on file    Physically abused: Not on file    Forced sexual activity: Not on file  Other Topics Concern  . Not on file  Social History Narrative  . Not on file    Current Outpatient Medications on File Prior to Visit  Medication Sig Dispense Refill  . aspirin EC 81 MG tablet Take by mouth.    . B Complex Vitamins (VITAMIN-B COMPLEX) TABS Take by mouth.    . B Complex-Folic Acid (B COMPLEX  FORMULA 1) TABS Take by mouth.    . Biotin 2500 MCG CAPS Take by mouth.    . Cholecalciferol (VITAMIN D) 2000 units tablet Take by mouth.    Marland Kitchen GLUCOSAMINE-CHONDROITIN-CA-D3 PO Take by mouth.    . Magnesium 200 MG TABS Take by mouth.    . Multiple Vitamin (MULTI-VITAMINS) TABS Take by mouth.    . naproxen (NAPROSYN) 500 MG tablet TAKE 1 TABLET BY MOUTH TWICE A DAY FOR PAIN  0  . Probiotic Product (PROBIOTIC-10 PO) Take by mouth.    . Progesterone Micronized (PROGESTERONE PO) Take by mouth.    . Thyroid (LEVOTHYROXINE-LIOTHYRONINE) 60 MG TABS Take by mouth.    . TURMERIC PO Take by mouth.    . vitamin C (ASCORBIC ACID) 500 MG tablet Take by mouth.    . Tavaborole 5 % SOLN Apply topically once daily.     No current facility-administered medications on file prior to visit.       ROS:  Review of Systems  Constitutional: Negative for fatigue, fever and unexpected weight change.  Respiratory: Negative for cough, shortness of breath and wheezing.   Cardiovascular: Negative for chest pain, palpitations and leg swelling.  Gastrointestinal: Positive for constipation. Negative for blood in stool, diarrhea, nausea and vomiting.  Endocrine: Negative for cold intolerance, heat intolerance and polyuria.  Genitourinary: Negative for dyspareunia, dysuria, flank pain, frequency, genital sores, hematuria, menstrual problem, pelvic pain, urgency, vaginal bleeding, vaginal discharge and vaginal pain.  Musculoskeletal: Negative for back pain, joint swelling and myalgias.  Skin: Negative for rash.  Neurological: Negative for dizziness, syncope, light-headedness, numbness and headaches.  Hematological: Negative for adenopathy.  Psychiatric/Behavioral: Negative for agitation, confusion, sleep disturbance and suicidal ideas. The patient is not nervous/anxious.      Objective: BP 102/68   Pulse 67   Ht 5' 2.5" (1.588 m)   Wt 127 lb (57.6 kg)   BMI 22.86 kg/m    Physical Exam  Constitutional: She is  oriented to person, place, and time. She appears well-developed and well-nourished.  Genitourinary: Vagina normal and uterus normal. There is no rash or tenderness on the right labia. There is no rash or tenderness on the left labia. No erythema or tenderness in the vagina. No vaginal discharge found. Right adnexum does not display mass and does not display tenderness. Left adnexum does not display mass and does not display tenderness. Cervix does not exhibit motion tenderness or polyp. Uterus is  not enlarged or tender.  Neck: Normal range of motion. No thyromegaly present.  Cardiovascular: Normal rate, regular rhythm and normal heart sounds.  No murmur heard. Pulmonary/Chest: Effort normal and breath sounds normal. Right breast exhibits no mass, no nipple discharge, no skin change and no tenderness. Left breast exhibits no mass, no nipple discharge, no skin change and no tenderness.  Abdominal: Soft. There is no tenderness. There is no guarding.  Musculoskeletal: Normal range of motion.  Neurological: She is alert and oriented to person, place, and time. No cranial nerve deficit.  Psychiatric: She has a normal mood and affect. Her behavior is normal.  Vitals reviewed.   Assessment/Plan: Encounter for annual routine gynecological examination  Cervical cancer screening - Plan: IGP, Aptima HPV  Screening for HPV (human papillomavirus) - Plan: IGP, Aptima HPV  Screening for breast cancer - Pt to sched mammo - Plan: MM DIGITAL SCREENING BILATERAL  Hormone replacement therapy (HRT) - Cont HRT, but decrease ERT dose. Since so small in syringe, do 0.1 ml QOD to see if sx stable. If so, will wean off ERT next yr. Rx RF to [facility]. - Plan: AMBULATORY NON FORMULARY MEDICATION, AMBULATORY NON FORMULARY MEDICATION  Family history of ovarian cancer - Pt is MyRisk neg. Declines ca-125/GYN u/s this yr, although pt aware no great screening options.   Meds ordered this encounter  Medications  .  AMBULATORY NON FORMULARY MEDICATION    Sig: Biest 3% 70/30 cream Apply 0.1 ml daily, dispense 5 ml syringes    Dispense:  30 mL    Refill:  1    Please hold Rx until pt calls for RF.    Order Specific Question:   Supervising Provider    Answer:   Gae Dry [[id number]]  . AMBULATORY NON FORMULARY MEDICATION    Sig: Progesterone oral 150 mg Take 1 capsule daily 6 days on, 1 day off    Dispense:  90 capsule    Refill:  3    Pt ready for RF    Order Specific Question:   Supervising Provider    Answer:   Gae Dry [[id number]]             GYN counsel breast self exam, mammography screening, use and side effects of HRT, menopause, adequate intake of calcium and vitamin D, diet and exercise     F/U  Return in about 1 year (around 06/14/2019).  Kosisochukwu Burningham B. Hailey Miles, PA-C 06/13/2018 4:13 PM

## 2018-06-17 LAB — IGP, APTIMA HPV
HPV APTIMA: NEGATIVE
PAP Smear Comment: 0

## 2018-10-04 ENCOUNTER — Encounter: Payer: Self-pay | Admitting: Obstetrics and Gynecology

## 2018-10-08 ENCOUNTER — Encounter: Payer: Self-pay | Admitting: Obstetrics and Gynecology

## 2019-04-22 ENCOUNTER — Other Ambulatory Visit: Payer: Self-pay | Admitting: Obstetrics and Gynecology

## 2019-04-22 DIAGNOSIS — Z79890 Hormone replacement therapy (postmenopausal): Secondary | ICD-10-CM

## 2019-04-22 MED ORDER — AMBULATORY NON FORMULARY MEDICATION: 0 refills | Status: DC

## 2019-04-22 NOTE — Progress Notes (Signed)
Rx RF biest until annual.

## 2019-07-22 NOTE — Progress Notes (Signed)
Chief Complaint  Patient presents with  . Gynecologic Exam     HPI:      [name] is a 58 y.o. [phone] who LMP was No LMP recorded. Patient is postmenopausal., presents today for her annual examination.   Her menses are absent due to menopause. She does not have intermenstrual bleeding.  She has minimal vasomotor sx. She uses estradiol and progesterone with sx relief. She is on biest 3% 70/30 crm 0.1 ml QOD and prog 150 mg. She denies any side effects and wants to cont meds. Weaned down last yr and QOD dose holding sx for the most part. Has waves of increased sx.  Rx RF goes to [facility].   Sex activity: single partner, contraception - post menopausal status. She does have vaginal dryness and uses lubricants with sx relief.  Last Pap: 06/13/18  Results were: no abnormalities /neg HPV DNA.  Hx of STDs: none  Last mammogram: 10/04/2018 at [facility]. Results were: normal--routine follow-up in 12 months There is no FH of breast cancer. There is a FH of ovarian cancer in her mom. Pt was BRCA neg 2014 and My Risk neg 2017. Pt aware no great screening recommendations. Has done GYN u/s and ca-125 in past and neg. The patient does do self-breast exams.  Colonoscopy: colonoscopy 7 years ago without abnormalities. Repeat--10 yrs. Has some issues with constipation, relieved with increased fiber/exercise. DEXA with PCP 2017 was T-1.5 in spine.   Tobacco use: None Alcohol use: social drinker Exercise: moderately active  She does not get adequate calcium and Vitamin D in her diet. Needs to restart supp.  She has labs with PCP.   Past Medical History:  Diagnosis Date  . Alopecia   . Amenorrhea   . Anxiety   . BRCA negative   . Fibroid   . Hormone disorder   . IBS (irritable bowel syndrome)   . Pyelonephritis   . Thyroid disease     Past Surgical History:  Procedure Laterality Date  . CESAREAN SECTION    . COLONOSCOPY  2013  . OPEN REDUCTION INTERNAL FIXATION  (ORIF) CALCANEAL FRACTURE WITH FUSION      Family History  Problem Relation Age of Onset  . Rheum arthritis Mother   . Osteoporosis Mother   . Ovarian cancer Mother   . Cancer Mother 30       ovarian/uterine   . Coronary artery disease Father   . Diabetes Father   . Coronary artery disease Maternal Grandfather   . Hyperlipidemia Maternal Grandfather   . Coronary artery disease Paternal Grandmother   . Diabetes Paternal Grandmother   . Heart failure Paternal Grandfather     Social History   Socioeconomic History  . Marital status: Married    Spouse name: Not on file  . Number of children: Not on file  . Years of education: Not on file  . Highest education level: Not on file  Occupational History  . Not on file  Social Needs  . Financial resource strain: Not on file  . Food insecurity    Worry: Not on file    Inability: Not on file  . Transportation needs    Medical: Not on file    Non-medical: Not on file  Tobacco Use  . Smoking status: Never Smoker  . Smokeless tobacco: Never Used  Substance and Sexual Activity  . Alcohol use: Yes    Comment: occaisonal  . Drug use: No  . Sexual activity:  Yes    Birth control/protection: Post-menopausal  Lifestyle  . Physical activity    Days per week: Not on file    Minutes per session: Not on file  . Stress: Not on file  Relationships  . Social Herbalist on phone: Not on file    Gets together: Not on file    Attends religious service: Not on file    Active member of club or organization: Not on file    Attends meetings of clubs or organizations: Not on file    Relationship status: Not on file  . Intimate partner violence    Fear of current or ex partner: Not on file    Emotionally abused: Not on file    Physically abused: Not on file    Forced sexual activity: Not on file  Other Topics Concern  . Not on file  Social History Narrative  . Not on file    Current Outpatient Medications on File Prior to  Visit  Medication Sig Dispense Refill  . aspirin EC 81 MG tablet Take by mouth.    . Progesterone Micronized (PROGESTERONE PO) Take by mouth.    . Thyroid (LEVOTHYROXINE-LIOTHYRONINE) 60 MG TABS Take by mouth.    . vitamin C (ASCORBIC ACID) 500 MG tablet Take by mouth.    . Zinc 30 MG TABS Take by mouth.     No current facility-administered medications on file prior to visit.       ROS:  Review of Systems  Constitutional: Negative for fatigue, fever and unexpected weight change.  Respiratory: Negative for cough, shortness of breath and wheezing.   Cardiovascular: Negative for chest pain, palpitations and leg swelling.  Gastrointestinal: Positive for constipation. Negative for blood in stool, diarrhea, nausea and vomiting.  Endocrine: Negative for cold intolerance, heat intolerance and polyuria.  Genitourinary: Negative for dyspareunia, dysuria, flank pain, frequency, genital sores, hematuria, menstrual problem, pelvic pain, urgency, vaginal bleeding, vaginal discharge and vaginal pain.  Musculoskeletal: Negative for back pain, joint swelling and myalgias.  Skin: Negative for rash.  Neurological: Negative for dizziness, syncope, light-headedness, numbness and headaches.  Hematological: Negative for adenopathy.  Psychiatric/Behavioral: Positive for agitation. Negative for confusion, sleep disturbance and suicidal ideas. The patient is not nervous/anxious.      Objective: BP 100/60   Ht 5' 3"  (1.6 m)   Wt 127 lb 12.8 oz (58 kg)   BMI 22.64 kg/m    Physical Exam Constitutional:      Appearance: She is well-developed.  Genitourinary:     Vulva, vagina, uterus, right adnexa and left adnexa normal.     No vulval lesion or tenderness noted.     No vaginal discharge, erythema or tenderness.     No cervical motion tenderness or polyp.     Uterus is not enlarged or tender.     No right or left adnexal mass present.     Right adnexa not tender.     Left adnexa not tender.  Neck:      Musculoskeletal: Normal range of motion.     Thyroid: No thyromegaly.  Cardiovascular:     Rate and Rhythm: Normal rate and regular rhythm.     Heart sounds: Normal heart sounds. No murmur.  Pulmonary:     Effort: Pulmonary effort is normal.     Breath sounds: Normal breath sounds.  Chest:     Breasts:        Right: No mass, nipple discharge, skin change or tenderness.  Left: No mass, nipple discharge, skin change or tenderness.  Abdominal:     Palpations: Abdomen is soft.     Tenderness: There is no abdominal tenderness. There is no guarding.  Musculoskeletal: Normal range of motion.  Neurological:     General: No focal deficit present.     Mental Status: She is alert and oriented to person, place, and time.     Cranial Nerves: No cranial nerve deficit.  Skin:    General: Skin is warm and dry.  Psychiatric:        Mood and Affect: Mood normal.        Behavior: Behavior normal.        Thought Content: Thought content normal.        Judgment: Judgment normal.  Vitals signs reviewed.     Assessment/Plan: Encounter for annual routine gynecological examination -   Screening for breast cancer - Plan: MM 3D SCREEN BREAST BILATERAL, Pt has mammo sched 11/20  Family history of ovarian cancer - Plan: MyRisk neg. No further screening recommended.  Hormone replacement therapy (HRT) - Plan: AMBULATORY NON FORMULARY MEDICATION, AMBULATORY NON FORMULARY MEDICATION,   Hormone replacement therapy (HRT) - Cont HRT, do 0.1 ml QOD to QD prn sx. Rx RF to [facility]. - Plan: AMBULATORY NON FORMULARY MEDICATION, AMBULATORY NON FORMULARY MEDICATION,   Meds ordered this encounter  Medications  . AMBULATORY NON FORMULARY MEDICATION    Sig: Progesterone oral 150 mg Take 1 capsule daily 6 days on, 1 day off    Dispense:  90 capsule    Refill:  3    Pls hold RF    Order Specific Question:   Supervising Provider    Answer:   Gae Dry [[id number]]  . AMBULATORY NON FORMULARY  MEDICATION    Sig: Biest 3% 70/30 cream Apply 0.1 ml daily to QOD prn sx, dispense 5 ml syringes    Dispense:  30 mL    Refill:  0    Pls hold RF    Order Specific Question:   Supervising Provider    Answer:   Gae Dry [[id number]]             GYN counsel breast self exam, mammography screening, use and side effects of HRT, menopause, adequate intake of calcium and vitamin D, diet and exercise     F/U  Return in about 1 year (around 07/22/2020).  Jaleena Viviani B. Jorian [place], PA-C 07/23/2019 10:48 AM

## 2019-07-23 ENCOUNTER — Ambulatory Visit (INDEPENDENT_AMBULATORY_CARE_PROVIDER_SITE_OTHER): Payer: BLUE CROSS/BLUE SHIELD | Admitting: Obstetrics and Gynecology

## 2019-07-23 ENCOUNTER — Encounter: Payer: Self-pay | Admitting: Obstetrics and Gynecology

## 2019-07-23 ENCOUNTER — Other Ambulatory Visit: Payer: Self-pay

## 2019-07-23 VITALS — BP 100/60 | Ht 63.0 in | Wt 127.8 lb

## 2019-07-23 DIAGNOSIS — Z01419 Encounter for gynecological examination (general) (routine) without abnormal findings: Secondary | ICD-10-CM

## 2019-07-23 DIAGNOSIS — Z79890 Hormone replacement therapy (postmenopausal): Secondary | ICD-10-CM

## 2019-07-23 DIAGNOSIS — Z1239 Encounter for other screening for malignant neoplasm of breast: Secondary | ICD-10-CM

## 2019-07-23 DIAGNOSIS — Z8041 Family history of malignant neoplasm of ovary: Secondary | ICD-10-CM

## 2019-07-23 MED ORDER — AMBULATORY NON FORMULARY MEDICATION: 0 refills | Status: DC

## 2019-07-23 MED ORDER — AMBULATORY NON FORMULARY MEDICATION: 3 refills | Status: DC

## 2019-07-23 NOTE — Patient Instructions (Signed)
I value your feedback and entrusting us with your care. If you get a [facility] patient survey, I would appreciate you taking the time to let us know about your experience today. Thank you! 

## 2019-07-24 ENCOUNTER — Emergency Department
Admission: EM | Admit: 2019-07-24 | Discharge: 2019-07-24 | Disposition: A | Discharge status: Home / Self Care | Payer: BLUE CROSS/BLUE SHIELD | Attending: Student in an Organized Health Care Education/Training Program | Admitting: Student in an Organized Health Care Education/Training Program

## 2019-07-24 ENCOUNTER — Encounter: Payer: Self-pay | Admitting: Emergency Medicine

## 2019-07-24 ENCOUNTER — Other Ambulatory Visit: Payer: Self-pay

## 2019-07-24 ENCOUNTER — Emergency Department: Payer: BLUE CROSS/BLUE SHIELD

## 2019-07-24 DIAGNOSIS — R1011 Right upper quadrant pain: Secondary | ICD-10-CM | POA: Diagnosis present

## 2019-07-24 DIAGNOSIS — Z79899 Other long term (current) drug therapy: Secondary | ICD-10-CM | POA: Diagnosis not present

## 2019-07-24 DIAGNOSIS — R109 Unspecified abdominal pain: Secondary | ICD-10-CM

## 2019-07-24 DIAGNOSIS — Z7982 Long term (current) use of aspirin: Secondary | ICD-10-CM | POA: Insufficient documentation

## 2019-07-24 DIAGNOSIS — E039 Hypothyroidism, unspecified: Secondary | ICD-10-CM | POA: Insufficient documentation

## 2019-07-24 DIAGNOSIS — N200 Calculus of kidney: Secondary | ICD-10-CM

## 2019-07-24 DIAGNOSIS — N132 Hydronephrosis with renal and ureteral calculous obstruction: Secondary | ICD-10-CM | POA: Insufficient documentation

## 2019-07-24 LAB — URINALYSIS, COMPLETE (UACMP) WITH MICROSCOPIC
Bacteria, UA: NONE SEEN
Bilirubin Urine: NEGATIVE
Glucose, UA: NEGATIVE mg/dL
Ketones, ur: 80 mg/dL — AB
Leukocytes,Ua: NEGATIVE
Nitrite: NEGATIVE
Protein, ur: NEGATIVE mg/dL
Specific Gravity, Urine: 1.026 (ref 1.005–1.030)
pH: 5 (ref 5.0–8.0)

## 2019-07-24 LAB — COMPREHENSIVE METABOLIC PANEL
ALT: 14 U/L (ref 0–44)
AST: 23 U/L (ref 15–41)
Albumin: 4.3 g/dL (ref 3.5–5.0)
Alkaline Phosphatase: 56 U/L (ref 38–126)
Anion gap: 10 (ref 5–15)
BUN: 17 mg/dL (ref 6–20)
CO2: 22 mmol/L (ref 22–32)
Calcium: 9.3 mg/dL (ref 8.9–10.3)
Chloride: 103 mmol/L (ref 98–111)
Creatinine, Ser: 0.76 mg/dL (ref 0.44–1.00)
GFR calc Af Amer: >60 mL/min (ref >60)
GFR calc non Af Amer: >60 mL/min (ref >60)
Glucose, Bld: 129 mg/dL — ABNORMAL HIGH (ref 70–99)
Potassium: 4.2 mmol/L (ref 3.5–5.1)
Sodium: 135 mmol/L (ref 135–145)
Total Bilirubin: 0.7 mg/dL (ref 0.3–1.2)
Total Protein: 7.1 g/dL (ref 6.5–8.1)

## 2019-07-24 LAB — CBC
HCT: 40 % (ref 36.0–46.0)
Hemoglobin: 13.4 g/dL (ref 12.0–15.0)
MCH: 31 pg (ref 26.0–34.0)
MCHC: 33.5 g/dL (ref 30.0–36.0)
MCV: 92.6 fL (ref 80.0–100.0)
Platelets: 228 10*3/uL (ref 150–400)
RBC: 4.32 MIL/uL (ref 3.87–5.11)
RDW: 11.6 % (ref 11.5–15.5)
WBC: 12.7 10*3/uL — ABNORMAL HIGH (ref 4.0–10.5)
nRBC: 0 % (ref 0.0–0.2)

## 2019-07-24 LAB — LIPASE, BLOOD: Lipase: 26 U/L (ref 11–51)

## 2019-07-24 MED ORDER — KETOROLAC TROMETHAMINE 30 MG/ML IJ SOLN: 15.0000 mg | Freq: Once | INTRAMUSCULAR | Status: DC

## 2019-07-24 MED ORDER — ONDANSETRON HCL 4 MG PO TABS: 4.0000 mg | ORAL_TABLET | Freq: Every day | ORAL | 0 refills | Status: AC | PRN

## 2019-07-24 MED ORDER — SODIUM CHLORIDE 0.9% FLUSH: 3.0000 mL | Freq: Once | INTRAVENOUS | Status: DC

## 2019-07-24 MED ORDER — ONDANSETRON HCL 4 MG/2ML IJ SOLN
4.0000 mg | Freq: Once | INTRAMUSCULAR | Status: DC
  Filled 2019-07-24: qty 2

## 2019-07-24 MED ORDER — SODIUM CHLORIDE 0.9 % IV BOLUS
500.0000 mL | Freq: Once | INTRAVENOUS | Status: AC
  Administered 2019-07-24: 500 mL via INTRAVENOUS

## 2019-07-24 MED ORDER — MORPHINE SULFATE (PF) 4 MG/ML IV SOLN
4.0000 mg | INTRAVENOUS | Status: DC | PRN
  Filled 2019-07-24: qty 1

## 2019-07-24 NOTE — ED Notes (Signed)
Patient transported to CT 

## 2019-07-24 NOTE — ED Triage Notes (Signed)
C/O right sided abdominal pain since 1100.  Also c/o nausea with symptoms.  Sent for ED evaluation by [facility].

## 2019-07-24 NOTE — ED Provider Notes (Signed)
[facility] Emergency Department Provider Note    First MD Initiated Contact with Patient 07/24/19 1806     (approximate)  I have reviewed the triage vital signs and the nursing notes.   HISTORY  Chief Complaint Abdominal Pain    HPI Kelly Ortiz is a 58 y.o. female below listed past medical history presents the ER with 1 day of progressively worsening constant right-sided abdominal pain.  Initially felt like it was right lower quadrant but now has migrated to the right upper quadrant.  Is not having any measured fevers but felt like she had a low-grade temperature at  in clinic.  Has never had symptoms like this before.  She is status post hysterectomy.  Denies any dysuria, constipation, nausea or vomiting.   Past Medical History:  Diagnosis Date  . Alopecia   . Amenorrhea   . Anxiety   . BRCA negative   . Fibroid   . Hormone disorder   . IBS (irritable bowel syndrome)   . Pyelonephritis   . Thyroid disease    Family History  Problem Relation Age of Onset  . Rheum arthritis Mother   . Osteoporosis Mother   . Ovarian cancer Mother   . Cancer Mother 75       ovarian/uterine   . Coronary artery disease Father   . Diabetes Father   . Coronary artery disease Maternal Grandfather   . Hyperlipidemia Maternal Grandfather   . Coronary artery disease Paternal Grandmother   . Diabetes Paternal Grandmother   . Heart failure Paternal Grandfather    Past Surgical History:  Procedure Laterality Date  . CESAREAN SECTION    . COLONOSCOPY  2013  . OPEN REDUCTION INTERNAL FIXATION (ORIF) CALCANEAL FRACTURE WITH FUSION     Patient Active Problem List   Diagnosis Date Noted  . Arthritis 06/13/2018  . Anxiety 06/13/2018  . Allergic rhinitis 06/13/2018  . Hypothyroidism due to Hashimoto's thyroiditis 12/31/2015  . Multiple thyroid nodules 12/31/2015  . Acquired hypothyroidism 12/28/2014      Prior to Admission medications   Medication Sig  Start Date End Date Taking? Authorizing Provider  AMBULATORY NON FORMULARY MEDICATION Progesterone oral 150 mg Take 1 capsule daily 6 days on, 1 day off 05/11/99  Yes Copland, Deirdre Evener, PA-C  AMBULATORY NON FORMULARY MEDICATION Biest 3% 70/30 cream Apply 0.1 ml daily to QOD prn sx, dispense 5 ml syringes 1/74/94  Yes Copland, Deirdre Evener, PA-C  aspirin EC 81 MG tablet Take by mouth.   Yes [provider]  Thyroid (LEVOTHYROXINE-LIOTHYRONINE) 60 MG TABS Take by mouth. Take one tablet by mouth on Mon, tues,thur,fridays, sat 01/04/17  Yes [provider]  vitamin C (ASCORBIC ACID) 500 MG tablet Take by mouth.   Yes [provider]  Zinc 30 MG TABS Take by mouth.   Yes [provider]    Allergies Iodine and Tetanus toxoids    Social History Social History   Tobacco Use  . Smoking status: Never Smoker  . Smokeless tobacco: Never Used  Substance Use Topics  . Alcohol use: Yes    Comment: occaisonal  . Drug use: No    Review of Systems Patient denies headaches, rhinorrhea, blurry vision, numbness, shortness of breath, chest pain, edema, cough, abdominal pain, nausea, vomiting, diarrhea, dysuria, fevers, rashes or hallucinations unless otherwise stated above in HPI. ____________________________________________   PHYSICAL EXAM:  VITAL SIGNS: Vitals:   07/24/19 1559 07/24/19 1900  BP: (!) 136/56 132/63  Pulse:  74 85  Resp: 18   Temp: 98.6 F (37 C)   SpO2: 100% 98%    Constitutional: Alert and oriented.  Eyes: Conjunctivae are normal.  Head: Atraumatic. Nose: No congestion/rhinnorhea. Mouth/Throat: Mucous membranes are moist.   Neck: No stridor. Painless ROM.  Cardiovascular: Normal rate, regular rhythm. Grossly normal heart sounds.  Good peripheral circulation. Respiratory: Normal respiratory effort.  No retractions. Lungs CTAB. Gastrointestinal: Soft with mild tpp and discomfort in rlq and ruq. No distention. No abdominal bruits. No CVA  tenderness. Genitourinary:  Musculoskeletal: No lower extremity tenderness nor edema.  No joint effusions. Neurologic:  Normal speech and language. No gross focal neurologic deficits are appreciated. No facial droop Skin:  Skin is warm, dry and intact. No rash noted. Psychiatric: Mood and affect are normal. Speech and behavior are normal.  ____________________________________________   LABS (all labs ordered are listed, but only abnormal results are displayed)  Results for orders placed or performed during the hospital encounter of 07/24/19 (from the past 24 hour(s))  Lipase, blood     Status: None   Collection Time: 07/24/19  4:02 PM  Result Value Ref Range   Lipase 26 11 - 51 U/L  Comprehensive metabolic panel     Status: Abnormal   Collection Time: 07/24/19  4:02 PM  Result Value Ref Range   Sodium 135 135 - 145 mmol/L   Potassium 4.2 3.5 - 5.1 mmol/L   Chloride 103 98 - 111 mmol/L   CO2 22 22 - 32 mmol/L   Glucose, Bld 129 (H) 70 - 99 mg/dL   BUN 17 6 - 20 mg/dL   Creatinine, Ser 0.76 0.44 - 1.00 mg/dL   Calcium 9.3 8.9 - 10.3 mg/dL   Total Protein 7.1 6.5 - 8.1 g/dL   Albumin 4.3 3.5 - 5.0 g/dL   AST 23 15 - 41 U/L   ALT 14 0 - 44 U/L   Alkaline Phosphatase 56 38 - 126 U/L   Total Bilirubin 0.7 0.3 - 1.2 mg/dL   GFR calc non Af Amer >60 >60 mL/min   GFR calc Af Amer >60 >60 mL/min   Anion gap 10 5 - 15  CBC     Status: Abnormal   Collection Time: 07/24/19  4:02 PM  Result Value Ref Range   WBC 12.7 (H) 4.0 - 10.5 K/uL   RBC 4.32 3.87 - 5.11 MIL/uL   Hemoglobin 13.4 12.0 - 15.0 g/dL   HCT 40.0 36.0 - 46.0 %   MCV 92.6 80.0 - 100.0 fL   MCH 31.0 26.0 - 34.0 pg   MCHC 33.5 30.0 - 36.0 g/dL   RDW 11.6 11.5 - 15.5 %   Platelets 228 150 - 400 K/uL   nRBC 0.0 0.0 - 0.2 %  Urinalysis, Complete w Microscopic     Status: Abnormal   Collection Time: 07/24/19  4:02 PM  Result Value Ref Range   Color, Urine YELLOW (A) YELLOW   APPearance CLEAR (A) CLEAR   Specific  Gravity, Urine 1.026 1.005 - 1.030   pH 5.0 5.0 - 8.0   Glucose, UA NEGATIVE NEGATIVE mg/dL   Hgb urine dipstick SMALL (A) NEGATIVE   Bilirubin Urine NEGATIVE NEGATIVE   Ketones, ur 80 (A) NEGATIVE mg/dL   Protein, ur NEGATIVE NEGATIVE mg/dL   Nitrite NEGATIVE NEGATIVE   Leukocytes,Ua NEGATIVE NEGATIVE   RBC / HPF 0-5 0 - 5 RBC/hpf   WBC, UA 0-5 0 - 5 WBC/hpf   Bacteria, UA NONE SEEN  NONE SEEN   Squamous Epithelial / LPF 0-5 0 - 5   Mucus PRESENT    ____________________________________________ ____________________________________________  RADIOLOGY  I personally reviewed all radiographic images ordered to evaluate for the above acute complaints and reviewed radiology reports and findings.  These findings were personally discussed with the patient.  Please see medical record for radiology report.  ____________________________________________   PROCEDURES  Procedure(s) performed:  Procedures    Critical Care performed: no ____________________________________________   INITIAL IMPRESSION / ASSESSMENT AND PLAN / ED COURSE  Pertinent labs & imaging results that were available during my care of the patient were reviewed by me and considered in my medical decision making (see chart for details).   DDX: appy, cholecystitis, cholelithiasis, stone, pyelo  Kelly Ortiz is a 58 y.o. who presents to the ED with symptoms as described above.  Patient afebrile hemodynamically stable with mild leukocytosis and right-sided abdominal pain.  CT imaging ordered for the by differential does show evidence of recently passed right-sided ureterolithiasis and hydro-.  No evidence of UTI.  Pain is controlled.  Discussed signs and symptoms which patient should return to the ER and discussed follow-up with PCP as well as urology.     The patient was evaluated in Emergency Department today for the symptoms described in the history of present illness. He/she was evaluated in the context of the  global COVID-19 pandemic, which necessitated consideration that the patient might be at risk for infection with the SARS-CoV-2 virus that causes COVID-19. Institutional protocols and algorithms that pertain to the evaluation of patients at risk for COVID-19 are in a state of rapid change based on information released by regulatory bodies including the CDC and federal and state organizations. These policies and algorithms were followed during the patient's care in the ED.  As part of my medical decision making, I reviewed the following data within the [facility] notes reviewed and incorporated, Labs reviewed, notes from prior ED visits and [place] Controlled Substance Database   ____________________________________________   FINAL CLINICAL IMPRESSION(S) / ED DIAGNOSES  Final diagnoses:  Acute flank pain  Ureterolithiasis      NEW MEDICATIONS STARTED DURING THIS VISIT:  New Prescriptions   No medications on file     Note:  This document was prepared using Dragon voice recognition software and may include unintentional dictation errors.    Merlyn Lot, MD 07/24/19 2011

## 2019-09-14 IMAGING — CT CT ABDOMEN AND PELVIS WITHOUT CONTRAST
2 of 4 series · 17 of 46 positions shown, 19 images · non-contrast
Comparison: None.

CLINICAL DATA: Acute onset right side abdominal pain and fever
today. Question appendicitis.

EXAM:
CT ABDOMEN AND PELVIS WITHOUT CONTRAST
TECHNIQUE: Multidetector CT imaging of the abdomen and pelvis was performed
following the standard protocol without IV contrast.

[Series 2: axial st · axial · 0.76mm/px · z∈[-701,-331]mm · 14 of 82 slices shown, 16 images]
[im 4/82  soft-tissue]
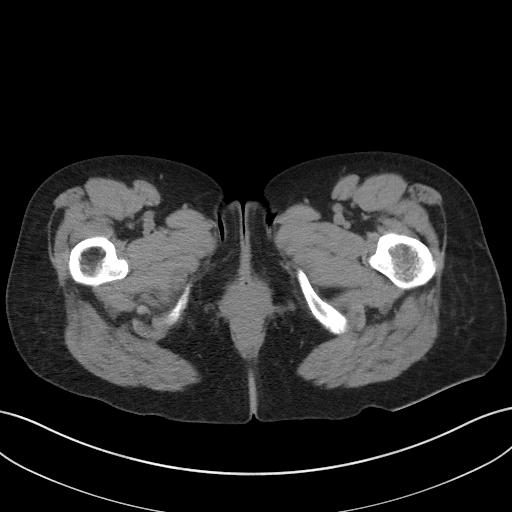
[im 4/82  bone]
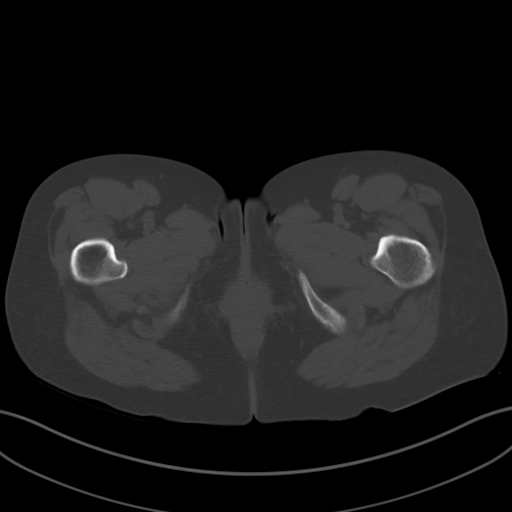
[im 10/82  soft-tissue]
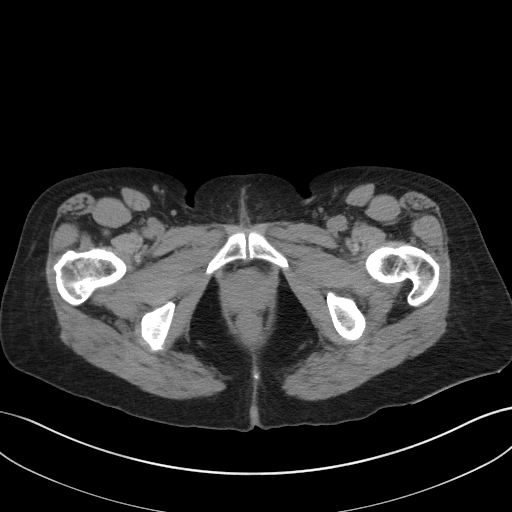
[im 17/82  soft-tissue]
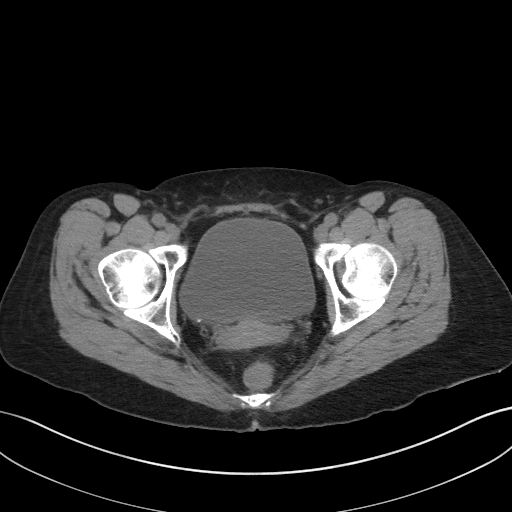
[im 23/82  soft-tissue]
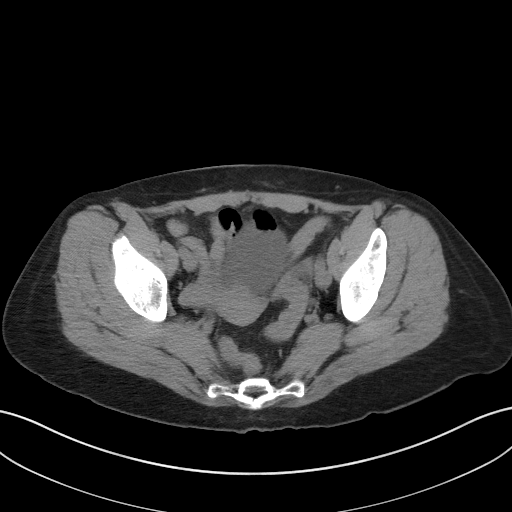
[im 26/82  soft-tissue]
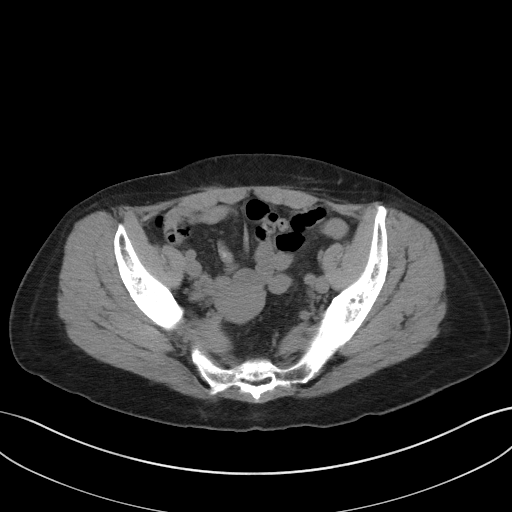
[im 33/82  soft-tissue]
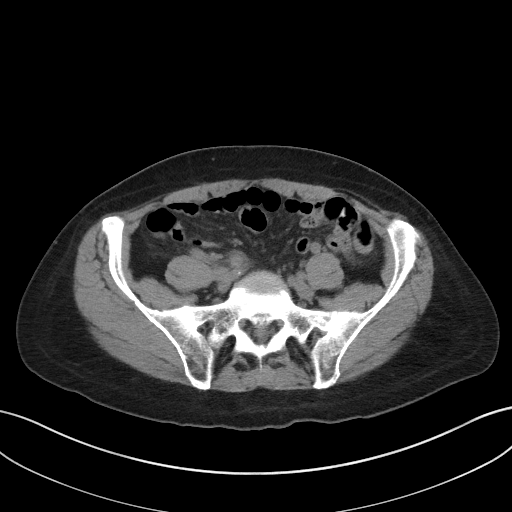
[im 39/82  soft-tissue]
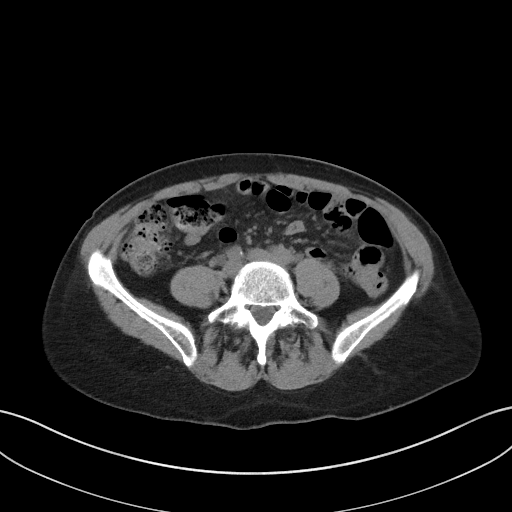
[im 43/82  soft-tissue]
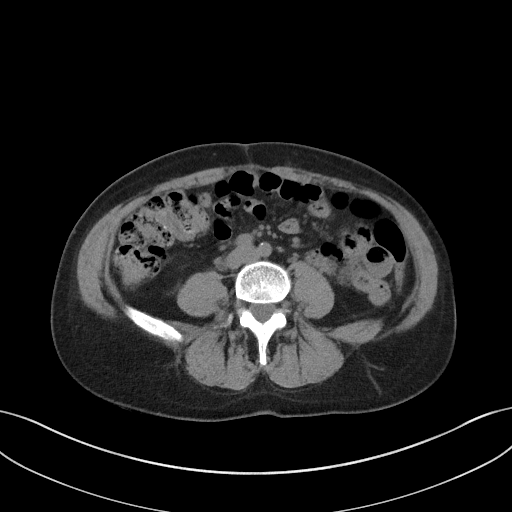
[im 49/82  soft-tissue]
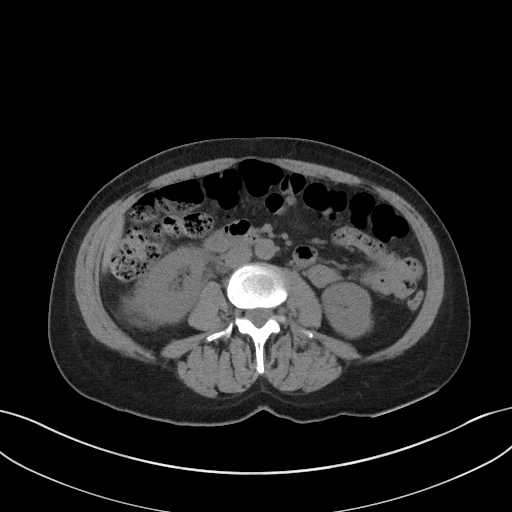
[im 49/82  bone]
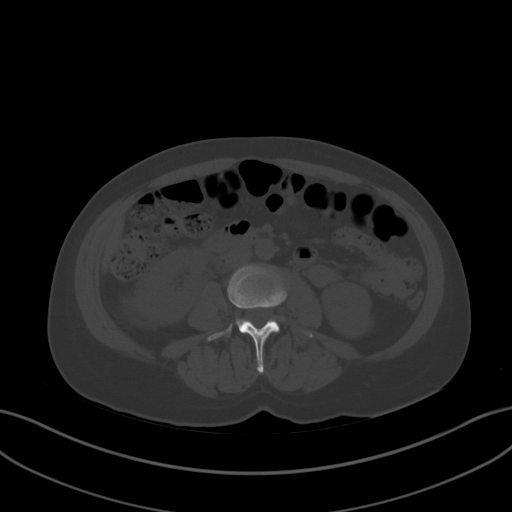
[im 56/82  soft-tissue]
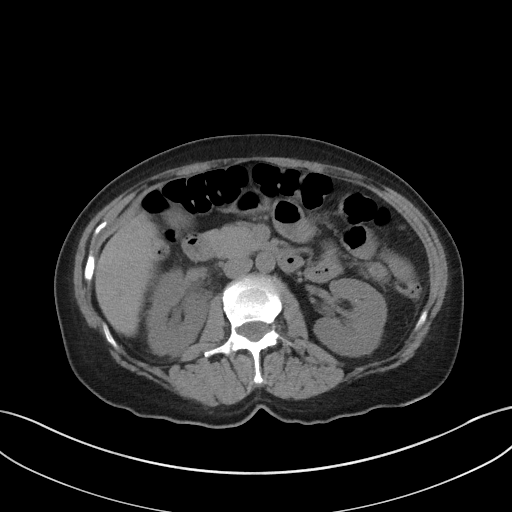
[im 62/82  soft-tissue]
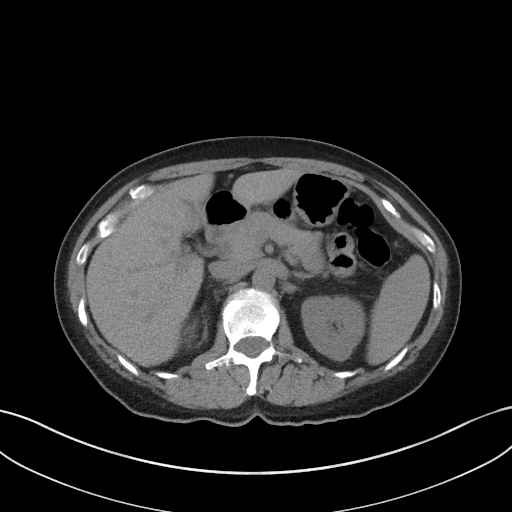
[im 65/82  soft-tissue]
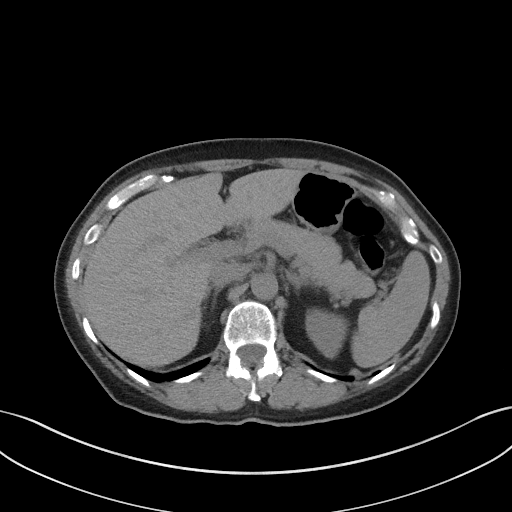
[im 72/82  soft-tissue]
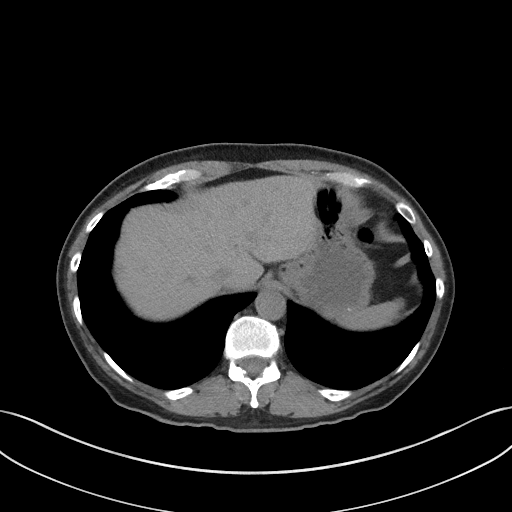
[im 78/82  soft-tissue]
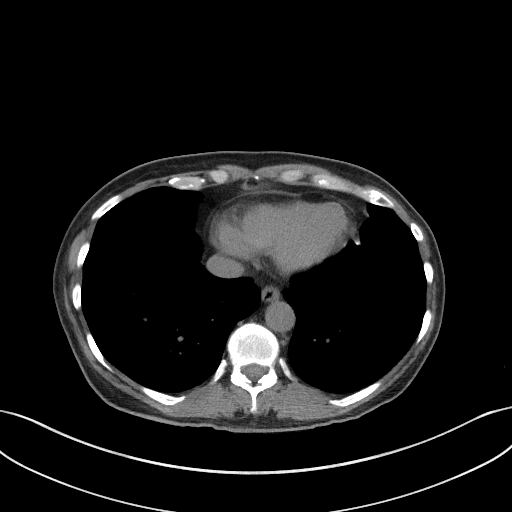

[Series 5: coronal st · coronal · 0.79mm/px · 3 of 69 slices shown]
[im 23/69  soft-tissue]
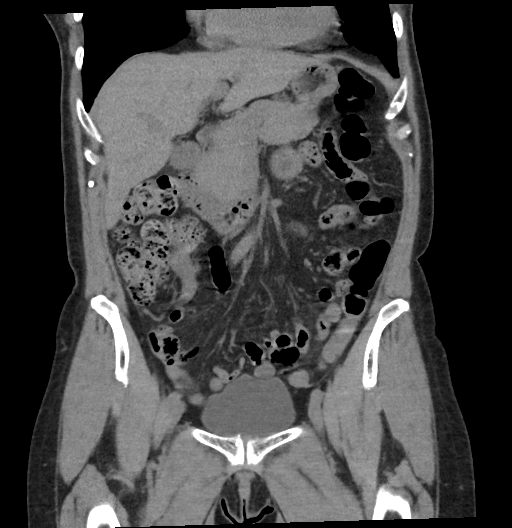
[im 31/69  soft-tissue]
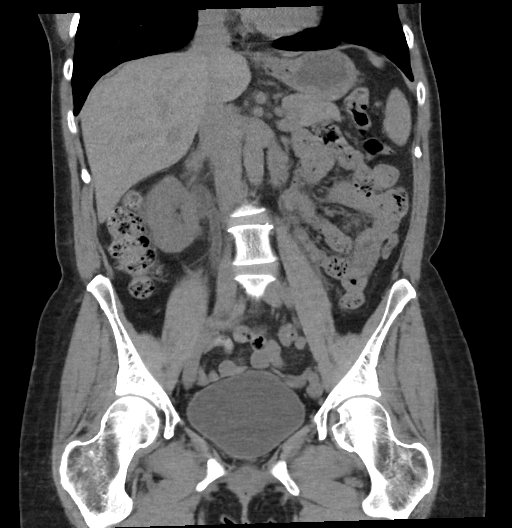
[im 38/69  soft-tissue]
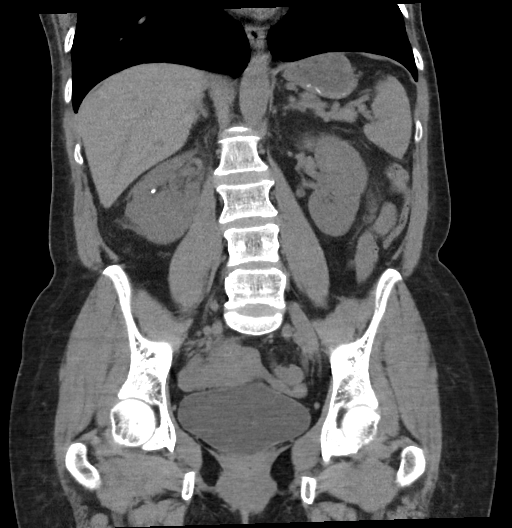

[17 of 46 positions shown; findings below may reference images not displayed]

FINDINGS: Lower chest: Lung bases are clear. No pleural or pericardial
effusion.

Hepatobiliary: No focal liver abnormality is seen. No gallstones,
gallbladder wall thickening, or biliary dilatation.

Pancreas: Unremarkable. No pancreatic ductal dilatation or
surrounding inflammatory changes.

Spleen: Normal in size without focal abnormality.

Adrenals/Urinary Tract: There is mild-to-moderate right
hydronephrosis. No ureteral stone is identified but the patient has
a 0.3 cm stone lying dependently within the urinary bladder. 3-4
small nonobstructing stones are seen in both kidneys. No left
hydronephrosis or ureteral stone. Adrenal glands appear normal.

Stomach/Bowel: Stomach is within normal limits. Appendix appears
normal. No evidence of bowel wall thickening, distention, or
inflammatory changes.

Vascular/Lymphatic: No significant vascular findings are present. No
enlarged abdominal or pelvic lymph nodes.

Reproductive: Uterus and bilateral adnexa are unremarkable.

Other: None.

Musculoskeletal: No acute or focal bony abnormality.
IMPRESSION: Mild right hydronephrosis is due to recent passage of right ureteral
stone. 0.3 cm stone is seen lying dependently in the urinary
bladder.

3-4 small nonobstructing stones are present in each kidney.

Negative for appendicitis.

## 2019-10-17 ENCOUNTER — Encounter: Payer: Self-pay | Admitting: Obstetrics and Gynecology

## 2019-10-22 ENCOUNTER — Encounter: Payer: Self-pay | Admitting: Obstetrics and Gynecology

## 2019-12-15 ENCOUNTER — Other Ambulatory Visit: Payer: BLUE CROSS/BLUE SHIELD

## 2020-01-04 ENCOUNTER — Encounter: Payer: Self-pay | Admitting: Obstetrics and Gynecology

## 2020-02-27 ENCOUNTER — Ambulatory Visit: Payer: BLUE CROSS/BLUE SHIELD | Attending: Internal Medicine

## 2020-02-27 DIAGNOSIS — Z23 Encounter for immunization: Secondary | ICD-10-CM

## 2020-02-27 NOTE — Progress Notes (Signed)
   Covid-19 Vaccination Clinic  Name:  MARIETTE SLIMAN    MRN: [id number] DOB: October 09, 1961  02/27/2020  Ms. Lafollette was observed post Covid-19 immunization for 15 minutes without incident. She was provided with Vaccine Information Sheet and instruction to access the V-Safe system.   Ms. Rayner was instructed to call 911 with any severe reactions post vaccine: Marland Kitchen Difficulty breathing  . Swelling of face and throat  . A fast heartbeat  . A bad rash all over body  . Dizziness and weakness

## 2020-03-24 ENCOUNTER — Ambulatory Visit: Payer: BLUE CROSS/BLUE SHIELD | Attending: Internal Medicine

## 2020-03-24 DIAGNOSIS — Z23 Encounter for immunization: Secondary | ICD-10-CM

## 2020-03-24 NOTE — Progress Notes (Signed)
   Covid-19 Vaccination Clinic  Name:  Kelly Ortiz    MRN: [id number] DOB: [birth year]  03/24/2020  Ms. Ketring was observed post Covid-19 immunization for 15 minutes without incident. She was provided with Vaccine Information Sheet and instruction to access the V-Safe system.   Ms. Goates was instructed to call 911 with any severe reactions post vaccine: Marland Kitchen Difficulty breathing  . Swelling of face and throat  . A fast heartbeat  . A bad rash all over body  . Dizziness and weakness   Immunizations Administered    Name Date Dose VIS Date Route   Pfizer COVID-19 Vaccine 03/24/2020  3:07 PM 0.3 mL 12/05/2019 Intramuscular   Manufacturer: [place]   Lot: [id number]   [place]: [id number]

## 2020-11-08 ENCOUNTER — Encounter: Payer: Self-pay | Admitting: Obstetrics and Gynecology

## 2020-11-09 ENCOUNTER — Telehealth: Payer: Self-pay

## 2020-11-09 NOTE — Telephone Encounter (Signed)
Pt needs to sched annual first (last seen 7/20) and will RF till then. Thx.

## 2020-11-09 NOTE — Telephone Encounter (Signed)
Pt needs a refill on her Progesterone, [facility]. Pt has enough for a couple days

## 2020-11-09 NOTE — Telephone Encounter (Signed)
Pls call pt to schedule annual and then route msg to [facility].

## 2020-11-10 ENCOUNTER — Other Ambulatory Visit: Payer: Self-pay | Admitting: Obstetrics and Gynecology

## 2020-11-10 DIAGNOSIS — Z79890 Hormone replacement therapy (postmenopausal): Secondary | ICD-10-CM

## 2020-11-10 MED ORDER — AMBULATORY NON FORMULARY MEDICATION: 0 refills | Status: DC

## 2020-11-10 NOTE — Telephone Encounter (Signed)
Rx RF [facility]ed to [facility]

## 2020-11-10 NOTE — Progress Notes (Signed)
Rx RF prog HRT till 1/22 annual

## 2020-11-10 NOTE — Telephone Encounter (Signed)
RF pls

## 2020-11-10 NOTE — Telephone Encounter (Signed)
Patient is sscheduled to first available appointment 01/05/21 with [facility].

## 2021-01-05 ENCOUNTER — Ambulatory Visit: Payer: BLUE CROSS/BLUE SHIELD | Admitting: Obstetrics and Gynecology

## 2021-01-24 ENCOUNTER — Other Ambulatory Visit: Payer: BLUE CROSS/BLUE SHIELD

## 2021-01-24 DIAGNOSIS — Z20822 Contact with and (suspected) exposure to covid-19: Secondary | ICD-10-CM

## 2021-01-25 LAB — NOVEL CORONAVIRUS, NAA: SARS-CoV-2, NAA: NOT DETECTED

## 2021-01-25 LAB — SARS-COV-2, NAA 2 DAY TAT

## 2021-02-01 NOTE — Progress Notes (Signed)
Chief Complaint  Patient presents with  . Gynecologic Exam    No concerns     HPI:      Kelly Ortiz is a 60 y.o. [id number] who LMP was No LMP recorded. Patient is postmenopausal., presents today for her annual examination.   Her menses are absent due to menopause. She does not have intermenstrual bleeding.  She has minimal vasomotor sx. She uses estradiol and progesterone with sx relief. She is on biest 3% 70/30 crm 0.1 ml QOD and prog 150 mg nightly for 6 nights, 1 night off. She denies any side effects and wants to cont meds. Has waves of increased sx.  Rx RF goes to [facility].   Sex activity: single partner, contraception - post menopausal status. She does have vaginal dryness and uses lubricants with sx relief.  Last Pap: 06/13/18  Results were: no abnormalities /neg HPV DNA.  Hx of STDs: none  Last mammogram: 11/03/20 at [facility]. Results were: normal--routine follow-up in 12 months There is no FH of breast cancer. There is a FH of ovarian cancer in her mom. Pt was BRCA neg 2014 and My Risk neg 2017. Pt aware no great screening recommendations. Has done GYN u/s and ca-125 in past and neg. The patient does do self-breast exams.  Colonoscopy: colonoscopy 2013 without abnormalities. Repeat--10 yrs. Hx of constipation, relieved with increased fiber/exercise.  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  She has labs with PCP.   Past Medical History:  Diagnosis Date  . Alopecia   . Amenorrhea   . Anxiety   . BRCA negative   . Fibroid   . Hormone disorder   . IBS (irritable bowel syndrome)   . Pyelonephritis   . Thyroid disease     Past Surgical History:  Procedure Laterality Date  . CESAREAN SECTION    . COLONOSCOPY  2013  . OPEN REDUCTION INTERNAL FIXATION (ORIF) CALCANEAL FRACTURE  WITH FUSION      Family History  Problem Relation Age of Onset  . Rheum arthritis Mother   . Osteoporosis Mother   . Ovarian cancer Mother   . Cancer Mother 28       ovarian/uterine   . Coronary artery disease Father   . Diabetes Father   . Coronary artery disease Maternal Grandfather   . Hyperlipidemia Maternal Grandfather   . Coronary artery disease Paternal Grandmother   . Diabetes Paternal Grandmother   . Heart failure Paternal Grandfather     Social History   Socioeconomic History  . Marital status: Married    Spouse name: Not on file  . Number of children: Not on file  . Years of education: Not on file  . Highest education level: Not on file  Occupational History  . Not on file  Tobacco Use  . Smoking status: Never Smoker  . Smokeless tobacco: Never Used  Vaping Use  . Vaping Use: Never used  Substance and Sexual Activity  . Alcohol use: Yes    Comment: occaisonal  . Drug use: No  . Sexual activity: Yes    Birth control/protection: Post-menopausal  Other Topics Concern  . Not on file  Social History Narrative  . Not on file   Social Determinants of Health   Financial Resource Strain: Not on file  Food Insecurity: Not  on file  Transportation Needs: Not on file  Physical Activity: Not on file  Stress: Not on file  Social Connections: Not on file  Intimate Partner Violence: Not on file    Current Outpatient Medications on File Prior to Visit  Medication Sig Dispense Refill  . aspirin EC 81 MG tablet Take by mouth.    . Biotin 2.5 MG CAPS Take by mouth.    . Cholecalciferol 25 MCG (1000 UT) tablet Take by mouth.    . Cyanocobalamin 2500 MCG CHEW Chew by mouth.    . Magnesium Citrate 100 MG TABS Take by mouth.    . Thyroid (LEVOTHYROXINE-LIOTHYRONINE) 60 MG TABS Take by mouth. Take one tablet by mouth on Mon, tues,thur,fridays, sat    . triamcinolone (KENALOG) 0.025 % cream Apply 1 application topically 2 (two) times daily.    . Zinc 30 MG TABS Take  by mouth.     No current facility-administered medications on file prior to visit.      ROS:  Review of Systems  Constitutional: Positive for fatigue. Negative for fever and unexpected weight change.  Respiratory: Negative for cough, shortness of breath and wheezing.   Cardiovascular: Negative for chest pain, palpitations and leg swelling.  Gastrointestinal: Positive for constipation. Negative for blood in stool, diarrhea, nausea and vomiting.  Endocrine: Negative for cold intolerance, heat intolerance and polyuria.  Genitourinary: Negative for dyspareunia, dysuria, flank pain, frequency, genital sores, hematuria, menstrual problem, pelvic pain, urgency, vaginal bleeding, vaginal discharge and vaginal pain.  Musculoskeletal: Negative for back pain, joint swelling and myalgias.  Skin: Negative for rash.  Neurological: Negative for dizziness, syncope, light-headedness, numbness and headaches.  Hematological: Negative for adenopathy.  Psychiatric/Behavioral: Positive for agitation and dysphoric mood. Negative for confusion, sleep disturbance and suicidal ideas. The patient is not nervous/anxious.      Objective: BP 110/80   Ht 5' 2"  (1.575 m)   Wt 130 lb (59 kg)   BMI 23.78 kg/m    Physical Exam Constitutional:      Appearance: She is well-developed.  Genitourinary:     Vulva normal.     Right Labia: No rash, tenderness or lesions.    Left Labia: No tenderness, lesions or rash.    No vaginal discharge, erythema or tenderness.      Right Adnexa: not tender and no mass present.    Left Adnexa: not tender and no mass present.    No cervical motion tenderness, friability or polyp.     Uterus is not enlarged or tender.  Breasts:     Right: No mass, nipple discharge, skin change or tenderness.     Left: No mass, nipple discharge, skin change or tenderness.    Neck:     Thyroid: No thyromegaly.  Cardiovascular:     Rate and Rhythm: Normal rate and regular rhythm.     Heart  sounds: Normal heart sounds. No murmur heard.   Pulmonary:     Effort: Pulmonary effort is normal.     Breath sounds: Normal breath sounds.  Abdominal:     Palpations: Abdomen is soft.     Tenderness: There is no abdominal tenderness. There is no guarding or rebound.  Musculoskeletal:        General: Normal range of motion.     Cervical back: Normal range of motion.  Lymphadenopathy:     Cervical: No cervical adenopathy.  Neurological:     General: No focal deficit present.     Mental Status: She is  alert and oriented to person, place, and time.     Cranial Nerves: No cranial nerve deficit.  Skin:    General: Skin is warm and dry.  Psychiatric:        Mood and Affect: Mood normal.        Behavior: Behavior normal.        Thought Content: Thought content normal.        Judgment: Judgment normal.  Vitals reviewed.     Assessment/Plan: Encounter for annual routine gynecological examination -   Screening for breast cancer - Plan: MM 3D SCREEN BREAST BILATERAL, pt to sched mammo 11/22  Family history of ovarian cancer - Plan: MyRisk neg. No further screening recommended.  Hormone replacement therapy (HRT) - Cont HRT, do 0.1 ml QOD to QD prn sx. Rx RF to [facility]. - Plan: AMBULATORY NON FORMULARY MEDICATION, AMBULATORY NON FORMULARY MEDICATION, Hard copy Rx given to pt so she can RF when needed.   Meds ordered this encounter  Medications  . AMBULATORY NON FORMULARY MEDICATION    Sig: Progesterone oral 150 mg Take 1 capsule daily 6 days on, 1 day off    Dispense:  90 capsule    Refill:  3    Order Specific Question:   Supervising Provider    Answer:   Gae Dry [id number]  . AMBULATORY NON FORMULARY MEDICATION    Sig: Biest 3% 70/30 cream Apply 0.1 ml daily to QOD prn sx, dispense 5 ml syringes    Dispense:  30 mL    Refill:  0    Order Specific Question:   Supervising Provider    Answer:   Gae Dry [[id number]]             GYN counsel breast self exam,  mammography screening, use and side effects of HRT, menopause, adequate intake of calcium and vitamin D, diet and exercise     F/U  Return in about 1 year (around 02/02/2022).  Torben Soloway B. Bricen Victory, PA-C 02/02/2021 3:35 PM

## 2021-02-02 ENCOUNTER — Ambulatory Visit (INDEPENDENT_AMBULATORY_CARE_PROVIDER_SITE_OTHER): Payer: BLUE CROSS/BLUE SHIELD | Admitting: Obstetrics and Gynecology

## 2021-02-02 ENCOUNTER — Encounter: Payer: Self-pay | Admitting: Obstetrics and Gynecology

## 2021-02-02 ENCOUNTER — Other Ambulatory Visit: Payer: Self-pay

## 2021-02-02 VITALS — BP 110/80 | Ht 62.0 in | Wt 130.0 lb

## 2021-02-02 DIAGNOSIS — Z1231 Encounter for screening mammogram for malignant neoplasm of breast: Secondary | ICD-10-CM

## 2021-02-02 DIAGNOSIS — Z8041 Family history of malignant neoplasm of ovary: Secondary | ICD-10-CM | POA: Diagnosis not present

## 2021-02-02 DIAGNOSIS — N951 Menopausal and female climacteric states: Secondary | ICD-10-CM

## 2021-02-02 DIAGNOSIS — Z79890 Hormone replacement therapy (postmenopausal): Secondary | ICD-10-CM | POA: Diagnosis not present

## 2021-02-02 DIAGNOSIS — Z01419 Encounter for gynecological examination (general) (routine) without abnormal findings: Secondary | ICD-10-CM | POA: Diagnosis not present

## 2021-02-02 MED ORDER — AMBULATORY NON FORMULARY MEDICATION: 0 refills | Status: DC

## 2021-02-02 MED ORDER — AMBULATORY NON FORMULARY MEDICATION: 3 refills | Status: DC

## 2021-02-02 NOTE — Patient Instructions (Signed)
I value your feedback and you entrusting us with your care. If you get a  patient survey, I would appreciate you taking the time to let us know about your experience today. Thank you! ? ? ?

## 2021-09-07 ENCOUNTER — Encounter: Payer: Self-pay | Admitting: Obstetrics and Gynecology

## 2021-09-07 ENCOUNTER — Other Ambulatory Visit: Payer: Self-pay

## 2021-09-07 ENCOUNTER — Ambulatory Visit (INDEPENDENT_AMBULATORY_CARE_PROVIDER_SITE_OTHER): Payer: BLUE CROSS/BLUE SHIELD | Admitting: Obstetrics and Gynecology

## 2021-09-07 VITALS — BP 90/60 | Ht 62.5 in | Wt 135.0 lb

## 2021-09-07 DIAGNOSIS — Z1231 Encounter for screening mammogram for malignant neoplasm of breast: Secondary | ICD-10-CM | POA: Diagnosis not present

## 2021-09-07 DIAGNOSIS — N644 Mastodynia: Secondary | ICD-10-CM | POA: Diagnosis not present

## 2021-09-07 NOTE — Progress Notes (Signed)
[place] Pink, MD   Chief Complaint  Patient presents with   Breast exam    LB soreness daily soreness since last mammogram    HPI:      [name] is a 60 y.o. G2P2002 whose LMP was No LMP recorded. Patient is postmenopausal., presents today for LT breast tenderness since neg mammo at [facility] 11/03/20. Has a tender spot on LT breast, bothersome with sleeping on LT side or with pressure. Thought it was due to skin tag in general area, but that has been removed and pt still with sx. No breast masses. On HRT with estrogen dosed QOD with mostly sx control; drinks some caffeine daily but not a lot.  There is no FH of breast cancer. There is a FH of ovarian cancer in her mom. Pt was BRCA neg 2014 and My Risk neg 2017. Annual due 2/23.   Past Medical History:  Diagnosis Date   Alopecia    Amenorrhea    Anxiety    BRCA negative    Fibroid    Hormone disorder    IBS (irritable bowel syndrome)    Pyelonephritis    Thyroid disease     Past Surgical History:  Procedure Laterality Date   CESAREAN SECTION     COLONOSCOPY  2013   OPEN REDUCTION INTERNAL FIXATION (ORIF) CALCANEAL FRACTURE WITH FUSION      Family History  Problem Relation Age of Onset   Rheum arthritis Mother    Osteoporosis Mother    Ovarian cancer Mother    Cancer Mother 66       ovarian/uterine    Coronary artery disease Father    Diabetes Father    Coronary artery disease Maternal Grandfather    Hyperlipidemia Maternal Grandfather    Coronary artery disease Paternal Grandmother    Diabetes Paternal Grandmother    Heart failure Paternal Grandfather     Social History   Socioeconomic History   Marital status: Married    Spouse name: Not on file   Number of children: Not on file   Years of education: Not on file   Highest education level: Not on file  Occupational History   Not on file  Tobacco Use   Smoking status: Never   Smokeless tobacco: Never  Vaping Use   Vaping Use: Never used   Substance and Sexual Activity   Alcohol use: Yes    Comment: occaisonal   Drug use: No   Sexual activity: Yes    Birth control/protection: Post-menopausal  Other Topics Concern   Not on file  Social History Narrative   Not on file   Social Determinants of Health   Financial Resource Strain: Not on file  Food Insecurity: Not on file  Transportation Needs: Not on file  Physical Activity: Not on file  Stress: Not on file  Social Connections: Not on file  Intimate Partner Violence: Not on file    Outpatient Medications Prior to Visit  Medication Sig Dispense Refill   AMBULATORY NON FORMULARY MEDICATION Progesterone oral 150 mg Take 1 capsule daily 6 days on, 1 day off 90 capsule 3   AMBULATORY NON FORMULARY MEDICATION Biest 3% 70/30 cream Apply 0.1 ml daily to QOD prn sx, dispense 5 ml syringes 30 mL 0   aspirin EC 81 MG tablet Take by mouth.     Biotin 2.5 MG CAPS Take by mouth.     CALCIUM PO Take by mouth.     Cholecalciferol 25 MCG (  1000 UT) tablet Take by mouth.     Cyanocobalamin 2500 MCG CHEW Chew by mouth.     Magnesium Citrate 100 MG TABS Take by mouth.     Thyroid (LEVOTHYROXINE-LIOTHYRONINE) 60 MG TABS Take by mouth. Take one tablet by mouth on Mon, tues,thur,fridays, sat     triamcinolone (KENALOG) 0.025 % cream Apply 1 application topically 2 (two) times daily.     Zinc 30 MG TABS Take by mouth.     No facility-administered medications prior to visit.      ROS:  Review of Systems  Constitutional:  Negative for fever.  Gastrointestinal:  Negative for blood in stool, constipation, diarrhea, nausea and vomiting.  Genitourinary:  Negative for dyspareunia, dysuria, flank pain, frequency, hematuria, urgency, vaginal bleeding, vaginal discharge and vaginal pain.  Musculoskeletal:  Negative for back pain.  Skin:  Negative for rash.  BREAST: tenderness   OBJECTIVE:   Vitals:  BP 90/60   Ht 5' 2.5" (1.588 m)   Wt 135 lb (61.2 kg)   BMI 24.30 kg/m    Physical Exam Vitals reviewed.  Pulmonary:     Effort: Pulmonary effort is normal.  Chest:  Breasts:    Breasts are symmetrical.     Right: No inverted nipple, mass, nipple discharge, skin change or tenderness.     Left: Tenderness present. No inverted nipple, mass, nipple discharge or skin change.    Musculoskeletal:        General: Normal range of motion.     Cervical back: Normal range of motion.  Skin:    General: Skin is warm and dry.  Neurological:     General: No focal deficit present.     Mental Status: She is alert and oriented to person, place, and time.     Cranial Nerves: No cranial nerve deficit.  Psychiatric:        Mood and Affect: Mood normal.        Behavior: Behavior normal.        Thought Content: Thought content normal.        Judgment: Judgment normal.    Assessment/Plan: Breast pain, left--discussed checking dx mammo and LT breast u/s. Given screening mammo due 11/22, will do at same time, unless sx increase between now and then. D/c caffeine in meantime. Will fax order to [facility] in [place]. F/u prn.     Return if symptoms worsen or fail to improve.  Aleecia Tapia B. Morine Kohlman, PA-C 09/07/2021 12:03 PM

## 2021-11-09 ENCOUNTER — Encounter: Payer: Self-pay | Admitting: Obstetrics and Gynecology

## 2022-02-06 ENCOUNTER — Ambulatory Visit (INDEPENDENT_AMBULATORY_CARE_PROVIDER_SITE_OTHER): Payer: BC Managed Care – PPO | Admitting: Obstetrics and Gynecology

## 2022-02-06 ENCOUNTER — Encounter: Payer: Self-pay | Admitting: Obstetrics and Gynecology

## 2022-02-06 ENCOUNTER — Other Ambulatory Visit: Payer: Self-pay

## 2022-02-06 VITALS — BP 110/60 | Ht 62.5 in | Wt 136.0 lb

## 2022-02-06 DIAGNOSIS — Z8041 Family history of malignant neoplasm of ovary: Secondary | ICD-10-CM | POA: Diagnosis not present

## 2022-02-06 DIAGNOSIS — Z1211 Encounter for screening for malignant neoplasm of colon: Secondary | ICD-10-CM | POA: Diagnosis not present

## 2022-02-06 DIAGNOSIS — Z1231 Encounter for screening mammogram for malignant neoplasm of breast: Secondary | ICD-10-CM | POA: Diagnosis not present

## 2022-02-06 DIAGNOSIS — Z01419 Encounter for gynecological examination (general) (routine) without abnormal findings: Secondary | ICD-10-CM | POA: Diagnosis not present

## 2022-02-06 DIAGNOSIS — Z79890 Hormone replacement therapy (postmenopausal): Secondary | ICD-10-CM

## 2022-02-06 DIAGNOSIS — N951 Menopausal and female climacteric states: Secondary | ICD-10-CM

## 2022-02-06 MED ORDER — AMBULATORY NON FORMULARY MEDICATION: 1 refills | Status: DC

## 2022-02-06 MED ORDER — AMBULATORY NON FORMULARY MEDICATION: 3 refills | Status: DC

## 2022-02-06 NOTE — Patient Instructions (Signed)
I value your feedback and you entrusting us with your care. If you get a [facility] patient survey, I would appreciate you taking the time to let us know about your experience today. Thank you! ? ? ?

## 2022-02-06 NOTE — Progress Notes (Signed)
Chief Complaint  Patient presents with   Gynecologic Exam    No concerns     HPI:      [name] is a 61 y.o. G2P2002 who LMP was No LMP recorded. Patient is postmenopausal., presents today for her annual examination.   Her menses are absent due to menopause. She does not have PMB.   She has minimal vasomotor sx. She uses estradiol and progesterone with sx relief. She is on biest 3% 70/30 crm 0.1 ml QOD and prog 150 mg nightly for 6 nights, 1 night off. She denies any side effects and wants to cont meds. Has waves of increased sx, particularly when under increased stress.  Rx RF goes to [facility].    Sex activity: single partner, contraception - post menopausal status. She does have vaginal dryness and uses lubricants with sx relief.   Last Pap: 06/13/18  Results were: no abnormalities /neg HPV DNA.  Hx of STDs: none   Last mammogram: 11/09/21 at [facility]. Results were: normal--routine follow-up in 12 months There is no FH of breast cancer. There is a FH of ovarian cancer in her mom. Pt was BRCA neg 2014 and My Risk neg 2017. Pt aware no great screening recommendations. Has done GYN u/s and ca-125 in past and neg. The patient does do self-breast exams.   Colonoscopy: colonoscopy 2013 without abnormalities. Repeat--10 yrs. Hx of constipation, relieved with increased fiber/exercise. 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 Has upcoming DEXA with PCP   Tobacco use: None Alcohol use: social drinker No drug use Exercise: very active   She does get adequate calcium and Vitamin D in her diet.   She has labs with PCP.   Past Medical History:  Diagnosis Date   Alopecia    Amenorrhea    Anxiety    BRCA negative 2014   2014 BRCA neg; 2017 MyRisk update testing neg   Fibroid    Hormone disorder    IBS (irritable bowel syndrome)    Pyelonephritis    Thyroid disease     Past Surgical History:  Procedure  Laterality Date   CESAREAN SECTION     COLONOSCOPY  2013   OPEN REDUCTION INTERNAL FIXATION (ORIF) CALCANEAL FRACTURE WITH FUSION      Family History  Problem Relation Age of Onset   Rheum arthritis Mother    Osteoporosis Mother    Ovarian cancer Mother    Cancer Mother 15       ovarian/uterine    Coronary artery disease Father    Diabetes Father    Coronary artery disease Maternal Grandfather    Hyperlipidemia Maternal Grandfather    Coronary artery disease Paternal Grandmother    Diabetes Paternal Grandmother    Heart failure Paternal Grandfather     Social History   Socioeconomic History   Marital status: Married    Spouse name: Not on file   Number of children: Not on file   Years of education: Not on file   Highest education level: Not on file  Occupational History   Not on file  Tobacco Use   Smoking status: Never   Smokeless tobacco: Never  Vaping Use   Vaping Use: Never used  Substance and Sexual Activity   Alcohol use: Yes    Comment: occaisonal   Drug use: No   Sexual activity: Yes    Birth control/protection: Post-menopausal  Other Topics Concern   Not  on file  Social History Narrative   Not on file   Social Determinants of Health   Financial Resource Strain: Not on file  Food Insecurity: Not on file  Transportation Needs: Not on file  Physical Activity: Not on file  Stress: Not on file  Social Connections: Not on file  Intimate Partner Violence: Not on file    Current Outpatient Medications on File Prior to Visit  Medication Sig Dispense Refill   aspirin EC 81 MG tablet Take by mouth.     Biotin 2.5 MG CAPS Take by mouth.     CALCIUM PO Take by mouth.     Cholecalciferol 25 MCG (1000 UT) tablet Take by mouth.     Cyanocobalamin 2500 MCG CHEW Chew by mouth.     Magnesium Citrate 100 MG TABS Take by mouth.     Thyroid (LEVOTHYROXINE-LIOTHYRONINE) 60 MG TABS Take by mouth. Take one tablet by mouth on Mon, tues,thur,fridays, sat      triamcinolone (KENALOG) 0.025 % cream Apply 1 application topically 2 (two) times daily.     Zinc 30 MG TABS Take by mouth.     No current facility-administered medications on file prior to visit.      ROS:  Review of Systems  Constitutional:  Negative for fatigue, fever and unexpected weight change.  Respiratory:  Negative for cough, shortness of breath and wheezing.   Cardiovascular:  Negative for chest pain, palpitations and leg swelling.  Gastrointestinal:  Positive for constipation. Negative for blood in stool, diarrhea, nausea and vomiting.  Endocrine: Negative for cold intolerance, heat intolerance and polyuria.  Genitourinary:  Negative for dyspareunia, dysuria, flank pain, frequency, genital sores, hematuria, menstrual problem, pelvic pain, urgency, vaginal bleeding, vaginal discharge and vaginal pain.  Musculoskeletal:  Negative for back pain, joint swelling and myalgias.  Skin:  Negative for rash.  Neurological:  Negative for dizziness, syncope, light-headedness, numbness and headaches.  Hematological:  Negative for adenopathy.  Psychiatric/Behavioral:  Positive for agitation. Negative for confusion, dysphoric mood, sleep disturbance and suicidal ideas. The patient is not nervous/anxious.     Objective: BP 110/60    Ht 5' 2.5" (1.588 m)    Wt 136 lb (61.7 kg)    BMI 24.48 kg/m    Physical Exam Constitutional:      Appearance: She is well-developed.  Genitourinary:     Vulva normal.     Right Labia: No rash, tenderness or lesions.    Left Labia: No tenderness, lesions or rash.    No vaginal discharge, erythema or tenderness.      Right Adnexa: not tender and no mass present.    Left Adnexa: not tender and no mass present.    No cervical motion tenderness, friability or polyp.     Uterus is not enlarged or tender.  Breasts:    Right: No mass, nipple discharge, skin change or tenderness.     Left: No mass, nipple discharge, skin change or tenderness.  Neck:      Thyroid: No thyromegaly.  Cardiovascular:     Rate and Rhythm: Normal rate and regular rhythm.     Heart sounds: Normal heart sounds. No murmur heard. Pulmonary:     Effort: Pulmonary effort is normal.     Breath sounds: Normal breath sounds.  Abdominal:     Palpations: Abdomen is soft.     Tenderness: There is no abdominal tenderness. There is no guarding or rebound.  Musculoskeletal:        General: Normal  range of motion.     Cervical back: Normal range of motion.  Lymphadenopathy:     Cervical: No cervical adenopathy.  Neurological:     General: No focal deficit present.     Mental Status: She is alert and oriented to person, place, and time.     Cranial Nerves: No cranial nerve deficit.  Skin:    General: Skin is warm and dry.  Psychiatric:        Mood and Affect: Mood normal.        Behavior: Behavior normal.        Thought Content: Thought content normal.        Judgment: Judgment normal.  Vitals reviewed.    Assessment/Plan: Encounter for annual routine gynecological examination  Encounter for screening mammogram for malignant neoplasm of breast; pt does through [facility], current on mammo  Family history of ovarian cancer--pt is MyRisk neg, no further screening indicated at this time  Screening for colon cancer--pt to do ref through PCP, due this yr.  Vasomotor symptoms due to menopause - Plan: AMBULATORY NON FORMULARY MEDICATION, AMBULATORY NON FORMULARY MEDICATION; Rx RF, hard copy given to pt since not ready for RF currently.   Hormone replacement therapy (HRT) - Plan: AMBULATORY NON FORMULARY MEDICATION, AMBULATORY NON FORMULARY MEDICATION    Meds ordered this encounter  Medications   AMBULATORY NON FORMULARY MEDICATION    Sig: Progesterone oral 150 mg Take 1 capsule daily 6 days on, 1 day off    Dispense:  90 capsule    Refill:  3    Order Specific Question:   Supervising Provider    Answer:   Gae Dry [[id number]]   AMBULATORY NON FORMULARY  MEDICATION    Sig: Biest 3% 70/30 cream Apply 0.1 ml daily to QOD prn sx, dispense 5 ml syringes    Dispense:  30 mL    Refill:  1    Order Specific Question:   Supervising Provider    Answer:   Gae Dry [[id number]]             GYN counsel breast self exam, mammography screening, use and side effects of HRT, menopause, adequate intake of calcium and vitamin D, diet and exercise     F/U  Return in about 1 year (around 02/06/2023).  Kelly Bury B. Tangi Shroff, PA-C 02/06/2022 3:35 PM

## 2022-09-29 ENCOUNTER — Telehealth: Payer: Self-pay

## 2022-09-29 DIAGNOSIS — Z1231 Encounter for screening mammogram for malignant neoplasm of breast: Secondary | ICD-10-CM

## 2022-09-29 NOTE — Telephone Encounter (Signed)
Can you put in order for mammogram to [facility] for pt? Pt states we can let her know through [facility].

## 2022-09-30 NOTE — Telephone Encounter (Signed)
Order placed, pt notified via [facility]

## 2022-11-28 ENCOUNTER — Ambulatory Visit
Admission: RE | Admit: 2022-11-28 | Discharge: 2022-11-28 | Disposition: A | Discharge status: Home / Self Care | Payer: BC Managed Care – PPO | Source: Ambulatory Visit | Attending: Obstetrics and Gynecology | Admitting: Obstetrics and Gynecology

## 2022-11-28 DIAGNOSIS — Z1231 Encounter for screening mammogram for malignant neoplasm of breast: Secondary | ICD-10-CM | POA: Insufficient documentation

## 2022-12-04 ENCOUNTER — Inpatient Hospital Stay
Admission: RE | Admit: 2022-12-04 | Discharge: 2022-12-04 | Disposition: A | Discharge status: Home / Self Care | Payer: Self-pay | Source: Ambulatory Visit | Attending: *Deleted | Admitting: *Deleted

## 2022-12-04 ENCOUNTER — Other Ambulatory Visit: Payer: Self-pay | Admitting: *Deleted

## 2022-12-04 DIAGNOSIS — Z1231 Encounter for screening mammogram for malignant neoplasm of breast: Secondary | ICD-10-CM

## 2023-03-16 ENCOUNTER — Encounter: Payer: Self-pay | Admitting: Obstetrics and Gynecology

## 2023-03-18 ENCOUNTER — Other Ambulatory Visit: Payer: Self-pay | Admitting: Obstetrics and Gynecology

## 2023-03-18 DIAGNOSIS — N951 Menopausal and female climacteric states: Secondary | ICD-10-CM

## 2023-03-18 DIAGNOSIS — Z79890 Hormone replacement therapy (postmenopausal): Secondary | ICD-10-CM

## 2023-03-20 ENCOUNTER — Other Ambulatory Visit: Payer: Self-pay | Admitting: Obstetrics and Gynecology

## 2023-03-20 DIAGNOSIS — Z79890 Hormone replacement therapy (postmenopausal): Secondary | ICD-10-CM

## 2023-03-20 DIAGNOSIS — N951 Menopausal and female climacteric states: Secondary | ICD-10-CM

## 2023-03-20 MED ORDER — AMBULATORY NON FORMULARY MEDICATION: 0 refills | Status: DC

## 2023-03-20 NOTE — Progress Notes (Signed)
Rx RF Biest until 5/24 annual

## 2023-03-28 ENCOUNTER — Ambulatory Visit: Payer: BC Managed Care – PPO

## 2023-03-28 DIAGNOSIS — K635 Polyp of colon: Secondary | ICD-10-CM | POA: Diagnosis not present

## 2023-03-28 DIAGNOSIS — K640 First degree hemorrhoids: Secondary | ICD-10-CM | POA: Diagnosis not present

## 2023-03-28 DIAGNOSIS — K621 Rectal polyp: Secondary | ICD-10-CM | POA: Diagnosis not present

## 2023-03-28 DIAGNOSIS — Z1211 Encounter for screening for malignant neoplasm of colon: Secondary | ICD-10-CM | POA: Diagnosis present

## 2023-05-01 ENCOUNTER — Ambulatory Visit: Payer: BC Managed Care – PPO | Admitting: Obstetrics and Gynecology

## 2023-05-03 ENCOUNTER — Ambulatory Visit: Payer: BC Managed Care – PPO | Admitting: Obstetrics and Gynecology

## 2023-05-07 NOTE — Progress Notes (Unsigned)
No chief complaint on file.    HPI:      [name] is a 62 y.o. [phone] who LMP was No LMP recorded. Patient is postmenopausal., presents today for her annual examination.   Her menses are absent due to menopause. She does not have PMB.   She has minimal vasomotor sx. She uses estradiol and progesterone with sx relief. She is on biest 3% 70/30 crm 0.1 ml QOD and prog 150 mg nightly for 6 nights, 1 night off. She denies any side effects and wants to cont meds. Has waves of increased sx, particularly when under increased stress.  Rx RF goes to [facility].    Sex activity: single partner, contraception - post menopausal status. She does have vaginal dryness and uses lubricants with sx relief.   Last Pap: 06/13/18  Results were: no abnormalities /neg HPV DNA.  Hx of STDs: none   Last mammogram: 11/28/22. Results were: normal--routine follow-up in 12 months There is no FH of breast cancer. There is a FH of ovarian cancer in her mom. Pt was BRCA neg 2014 and My Risk neg 2017. Pt aware no great screening recommendations. Has done GYN u/s and ca-125 in past and neg. The patient does do self-breast exams.   Colonoscopy: colonoscopy 2024 without abnormalities. Repeat--10 yrs. Hx of constipation, relieved with increased fiber/exercise. ????  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    Tobacco use: None Alcohol use: social drinker No drug use Exercise: very active   She does get adequate calcium and Vitamin D in her diet.   She has labs with PCP.   Past Medical History:  Diagnosis Date   Alopecia    Amenorrhea    Anxiety    BRCA negative 2014   2014 BRCA neg; 2017 MyRisk update testing neg   Fibroid    Hormone disorder    IBS (irritable bowel syndrome)    Pyelonephritis    Thyroid disease     Past Surgical History:  Procedure Laterality Date   CESAREAN SECTION     COLONOSCOPY  2013   OPEN REDUCTION INTERNAL FIXATION (ORIF)  CALCANEAL FRACTURE WITH FUSION      Family History  Problem Relation Age of Onset   Rheum arthritis Mother    Osteoporosis Mother    Ovarian cancer Mother    Cancer Mother 45       ovarian/uterine    Coronary artery disease Father    Diabetes Father    Coronary artery disease Maternal Grandfather    Hyperlipidemia Maternal Grandfather    Coronary artery disease Paternal Grandmother    Diabetes Paternal Grandmother    Heart failure Paternal Grandfather    Breast cancer Neg Hx     Social History   Socioeconomic History   Marital status: Married    Spouse name: Not on file   Number of children: Not on file   Years of education: Not on file   Highest education level: Not on file  Occupational History   Not on file  Tobacco Use   Smoking status: Never   Smokeless tobacco: Never  Vaping Use   Vaping Use: Never used  Substance and Sexual Activity   Alcohol use: Yes    Comment: occaisonal   Drug use: No   Sexual activity: Yes    Birth control/protection: Post-menopausal  Other Topics Concern   Not on file  Social History Narrative   Not on file  Social Determinants of Health   Financial Resource Strain: Not on file  Food Insecurity: Not on file  Transportation Needs: Not on file  Physical Activity: Not on file  Stress: Not on file  Social Connections: Not on file  Intimate Partner Violence: Not on file    Current Outpatient Medications on File Prior to Visit  Medication Sig Dispense Refill   AMBULATORY NON FORMULARY MEDICATION Progesterone oral 150 mg Take 1 capsule daily 6 days on, 1 day off 90 capsule 3   AMBULATORY NON FORMULARY MEDICATION Biest 3% 70/30 cream Apply 0.1 ml daily to QOD prn sx, dispense 5 ml syringes 30 mL 0   aspirin EC 81 MG tablet Take by mouth.     Biotin 2.5 MG CAPS Take by mouth.     CALCIUM PO Take by mouth.     Cholecalciferol 25 MCG (1000 UT) tablet Take by mouth.     Cyanocobalamin 2500 MCG CHEW Chew by mouth.     Magnesium  Citrate 100 MG TABS Take by mouth.     Thyroid (LEVOTHYROXINE-LIOTHYRONINE) 60 MG TABS Take by mouth. Take one tablet by mouth on Mon, tues,thur,fridays, sat     triamcinolone (KENALOG) 0.025 % cream Apply 1 application topically 2 (two) times daily.     Zinc 30 MG TABS Take by mouth.     No current facility-administered medications on file prior to visit.      ROS:  Review of Systems  Constitutional:  Negative for fatigue, fever and unexpected weight change.  Respiratory:  Negative for cough, shortness of breath and wheezing.   Cardiovascular:  Negative for chest pain, palpitations and leg swelling.  Gastrointestinal:  Positive for constipation. Negative for blood in stool, diarrhea, nausea and vomiting.  Endocrine: Negative for cold intolerance, heat intolerance and polyuria.  Genitourinary:  Negative for dyspareunia, dysuria, flank pain, frequency, genital sores, hematuria, menstrual problem, pelvic pain, urgency, vaginal bleeding, vaginal discharge and vaginal pain.  Musculoskeletal:  Negative for back pain, joint swelling and myalgias.  Skin:  Negative for rash.  Neurological:  Negative for dizziness, syncope, light-headedness, numbness and headaches.  Hematological:  Negative for adenopathy.  Psychiatric/Behavioral:  Positive for agitation. Negative for confusion, dysphoric mood, sleep disturbance and suicidal ideas. The patient is not nervous/anxious.      Objective: There were no vitals taken for this visit.   Physical Exam Constitutional:      Appearance: She is well-developed.  Genitourinary:     Vulva normal.     Right Labia: No rash, tenderness or lesions.    Left Labia: No tenderness, lesions or rash.    No vaginal discharge, erythema or tenderness.      Right Adnexa: not tender and no mass present.    Left Adnexa: not tender and no mass present.    No cervical motion tenderness, friability or polyp.     Uterus is not enlarged or tender.  Breasts:    Right:  No mass, nipple discharge, skin change or tenderness.     Left: No mass, nipple discharge, skin change or tenderness.  Neck:     Thyroid: No thyromegaly.  Cardiovascular:     Rate and Rhythm: Normal rate and regular rhythm.     Heart sounds: Normal heart sounds. No murmur heard. Pulmonary:     Effort: Pulmonary effort is normal.     Breath sounds: Normal breath sounds.  Abdominal:     Palpations: Abdomen is soft.     Tenderness: There is no  abdominal tenderness. There is no guarding or rebound.  Musculoskeletal:        General: Normal range of motion.     Cervical back: Normal range of motion.  Lymphadenopathy:     Cervical: No cervical adenopathy.  Neurological:     General: No focal deficit present.     Mental Status: She is alert and oriented to person, place, and time.     Cranial Nerves: No cranial nerve deficit.  Skin:    General: Skin is warm and dry.  Psychiatric:        Mood and Affect: Mood normal.        Behavior: Behavior normal.        Thought Content: Thought content normal.        Judgment: Judgment normal.  Vitals reviewed.     Assessment/Plan: Encounter for annual routine gynecological examination  Encounter for screening mammogram for malignant neoplasm of breast; pt does through [facility], current on mammo  Family history of ovarian cancer--pt is MyRisk neg, no further screening indicated at this time  Screening for colon cancer--pt to do ref through PCP, due this yr.  Vasomotor symptoms due to menopause - Plan: AMBULATORY NON FORMULARY MEDICATION, AMBULATORY NON FORMULARY MEDICATION; Rx RF, hard copy given to pt since not ready for RF currently.   Hormone replacement therapy (HRT) - Plan: AMBULATORY NON FORMULARY MEDICATION, AMBULATORY NON FORMULARY MEDICATION    No orders of the defined types were placed in this encounter.            GYN counsel breast self exam, mammography screening, use and side effects of HRT, menopause, adequate intake of  calcium and vitamin D, diet and exercise     F/U  No follow-ups on file.  Kerina Simoneau B. Hatley Henegar, PA-C 05/07/2023 2:30 PM

## 2023-05-08 ENCOUNTER — Encounter: Payer: Self-pay | Admitting: Obstetrics and Gynecology

## 2023-05-08 ENCOUNTER — Ambulatory Visit (INDEPENDENT_AMBULATORY_CARE_PROVIDER_SITE_OTHER): Payer: BC Managed Care – PPO | Admitting: Obstetrics and Gynecology

## 2023-05-08 ENCOUNTER — Other Ambulatory Visit (HOSPITAL_COMMUNITY)
Admission: RE | Admit: 2023-05-08 | Discharge: 2023-05-08 | Disposition: A | Discharge status: Home / Self Care | Payer: BC Managed Care – PPO | Source: Ambulatory Visit | Attending: Obstetrics and Gynecology | Admitting: Obstetrics and Gynecology

## 2023-05-08 VITALS — BP 100/64 | Ht 62.5 in | Wt 138.0 lb

## 2023-05-08 DIAGNOSIS — Z1151 Encounter for screening for human papillomavirus (HPV): Secondary | ICD-10-CM | POA: Insufficient documentation

## 2023-05-08 DIAGNOSIS — Z124 Encounter for screening for malignant neoplasm of cervix: Secondary | ICD-10-CM | POA: Insufficient documentation

## 2023-05-08 DIAGNOSIS — Z1231 Encounter for screening mammogram for malignant neoplasm of breast: Secondary | ICD-10-CM

## 2023-05-08 DIAGNOSIS — N951 Menopausal and female climacteric states: Secondary | ICD-10-CM

## 2023-05-08 DIAGNOSIS — M810 Age-related osteoporosis without current pathological fracture: Secondary | ICD-10-CM

## 2023-05-08 DIAGNOSIS — Z01419 Encounter for gynecological examination (general) (routine) without abnormal findings: Secondary | ICD-10-CM

## 2023-05-08 DIAGNOSIS — Z1211 Encounter for screening for malignant neoplasm of colon: Secondary | ICD-10-CM

## 2023-05-08 DIAGNOSIS — Z79890 Hormone replacement therapy (postmenopausal): Secondary | ICD-10-CM

## 2023-05-08 DIAGNOSIS — Z8041 Family history of malignant neoplasm of ovary: Secondary | ICD-10-CM

## 2023-05-08 NOTE — Patient Instructions (Signed)
I value your feedback and you entrusting us with your care. If you get a  patient survey, I would appreciate you taking the time to let us know about your experience today. Thank you! ? ? ?

## 2023-05-09 LAB — CYTOLOGY - PAP
Adequacy: ABSENT
Comment: NEGATIVE
Diagnosis: NEGATIVE
High risk HPV: NEGATIVE

## 2023-05-25 ENCOUNTER — Telehealth: Payer: Self-pay

## 2023-05-26 NOTE — Telephone Encounter (Signed)
Pt can have #90 progesterone with 3 RF. May need to just call it in and then document in chart. It is compounded so has to be faxed. Thx.

## 2023-05-29 NOTE — Telephone Encounter (Signed)
Called pharmacy, spoke to [facility], verbal taken.

## 2023-05-29 NOTE — Telephone Encounter (Signed)
Pt calling; refill of progesterone has been denied; needs it called in to [facility] in [place].  Has a week left. [phone]  Adv pt it was verbally called in about 20 min ago; to give them time to get it ready and call before she goes to p/u.

## 2023-10-29 ENCOUNTER — Other Ambulatory Visit: Payer: Self-pay | Admitting: Obstetrics and Gynecology

## 2023-10-29 DIAGNOSIS — Z1231 Encounter for screening mammogram for malignant neoplasm of breast: Secondary | ICD-10-CM

## 2023-11-30 ENCOUNTER — Ambulatory Visit
Admission: RE | Admit: 2023-11-30 | Discharge: 2023-11-30 | Disposition: A | Discharge status: Home / Self Care | Payer: BC Managed Care – PPO | Source: Ambulatory Visit | Attending: Obstetrics and Gynecology | Admitting: Obstetrics and Gynecology

## 2023-11-30 DIAGNOSIS — Z1231 Encounter for screening mammogram for malignant neoplasm of breast: Secondary | ICD-10-CM | POA: Diagnosis present

## 2024-01-23 ENCOUNTER — Encounter: Payer: Self-pay | Admitting: Obstetrics and Gynecology

## 2024-01-23 DIAGNOSIS — N3946 Mixed incontinence: Secondary | ICD-10-CM

## 2024-04-23 ENCOUNTER — Encounter: Payer: Self-pay | Admitting: Physical Therapy

## 2024-04-23 ENCOUNTER — Ambulatory Visit: Payer: BC Managed Care – PPO | Attending: Obstetrics and Gynecology | Admitting: Physical Therapy

## 2024-04-23 DIAGNOSIS — M2170 Unequal limb length (acquired), unspecified site: Secondary | ICD-10-CM | POA: Insufficient documentation

## 2024-04-23 DIAGNOSIS — N3946 Mixed incontinence: Secondary | ICD-10-CM | POA: Diagnosis not present

## 2024-04-23 DIAGNOSIS — R2689 Other abnormalities of gait and mobility: Secondary | ICD-10-CM | POA: Diagnosis present

## 2024-04-23 DIAGNOSIS — R278 Other lack of coordination: Secondary | ICD-10-CM | POA: Insufficient documentation

## 2024-04-23 NOTE — Therapy (Addendum)
 OUTPATIENT PHYSICAL THERAPY EVALUATION   Patient Name: Kelly Ortiz MRN: [id number] DOB:12/15/1961, 63 y.o., female Today's Date: 04/23/2024   PT End of Session - 04/23/24 1431     Visit Number 1    Number of Visits 10    Date for PT Re-Evaluation 07/02/24    PT Start Time 1425    PT Stop Time 1505    PT Time Calculation (min) 40 min             Past Medical History:  Diagnosis Date   Alopecia    Amenorrhea    Anxiety    BRCA negative 2014   2014 BRCA neg; 2017 MyRisk update testing neg   Fibroid    Hormone disorder    IBS (irritable bowel syndrome)    Pyelonephritis    Thyroid  disease    Past Surgical History:  Procedure Laterality Date   CESAREAN SECTION     COLONOSCOPY  2013   OPEN REDUCTION INTERNAL FIXATION (ORIF) CALCANEAL FRACTURE WITH FUSION     Patient Active Problem List   Diagnosis Date Noted   Age-related osteoporosis without current pathological fracture 05/08/2023   Family history of ovarian cancer 02/06/2022   Arthritis 06/13/2018   Anxiety 06/13/2018   Allergic rhinitis 06/13/2018   Hypothyroidism due to Hashimoto's thyroiditis 12/31/2015   Multiple thyroid  nodules 12/31/2015   Acquired hypothyroidism 12/28/2014    PCP: Valora Agent , MD  REFERRING PROVIDER: [place]   REFERRING DIAG: N39.46 (ICD-10-CM) - Mixed incontinence urge and stress   Rationale for Evaluation and Treatment Rehabilitation  THERAPY DIAG:  Other abnormalities of gait and mobility  Leg length discrepancy  Other lack of coordination  ONSET DATE:   SUBJECTIVE:                                                                                                                                                                                           SUBJECTIVE STATEMENT:  1)  urge and stress incontinence:  started before and after pregnancy 25 years ago, worsened with menopause. Pt had 2 C sections . Pt wears period underwear and it is rare to change midday        Activities causing leakage: sneezing, getting up of floor ,  sit up motion with rowing.    Hx of  sacroiliac pain which started in college when she was washing a large fabric in a deep sink and leaning. Pt goes to chiropractic Tx which helps keep the pain at bay. Two days, L side hurt 3/10 pain level. Non radiating. Eases quickly. Pain with sit to stand.    2) plantar  fascitis B:  occurs  days per month,  3/10  occurs after walking in non athletic shoes.     PERTINENT HISTORY:   Fitness 1.5 miles in 20 min several days a week , stopping doing the rowing machine, improve balance / strength training at home   Osteoporosis, arthritis , 2 C sections   PAIN:  Are you having pain? Yes see above   PRECAUTIONS: None   WEIGHT BEARING RESTRICTIONS: No   FALLS:  Has patient fallen in last 6 months? No  LIVING ENVIRONMENT: Lives with: lives with their family Lives in: two story  home  Stairs:  2 STE   OCCUPATION: teacher  PLOF: IND   PATIENT GOALS:  Improve leakage  , being able to get off the floor, cough, sneezing , and use rowing machine with minimal and zero leakage    OBJECTIVE:    [facility] PT Assessment - 04/23/24 1438       Observation/Other Assessments   Observations slouched posture, ankles crossed      Sit to Stand   Comments poor alignment / technique      Strength   Overall Strength Comments R LE 4-/5, L 5/5      Palpation   SI assessment  R Shoulder and iliac crest higher      Ambulation/Gait   Gait Comments 1.22 m/s ( R trunk lean, decerased stance on R, limited posterior rotationof R thorax ),    post Tx: with shoe lift in toe box and heel L, 1.54 m/s             [facility] Adult PT Treatment/Exercise - 04/23/24 1438       Self-Care   Other Self-Care Comments  placed shoe lift in toe box and heel in R, explained to replace every 6 months      Therapeutic Activites    Other Therapeutic Activities explained role of posture, alignment of pelvis and spine  with shoe lift to yield longer benefits for pelvis goals and helping plantar fasciitis   Discussed plan to address stand<>floor for improved pelvic girdle stability and calf stretch to help plantar fasciitis      Neuro Re-ed    Neuro Re-ed Details  cued for body mechanics to minimize straining pelvic floor               HOME EXERCISE PROGRAM: See pt instruction section    ASSESSMENT:  CLINICAL IMPRESSION:  Pt is a 63 yo  who presents with  following issues which impact QOL, ADL,  fitness, social and community activities:    urge and stress incontinence plantar fascitis B  Pt's musculoskeletal assessment revealed uneven pelvic girdle and shoulder height, asymmetries to gait pattern, limited spinal /pelvic mobility, dyscoordination and strength of pelvic floor mm, hip weakness, poor body mechanics which places strain on the abdominal/pelvic floor mm. These are deficits that indicate an ineffective intraabdominal pressure system associated with increased risk for pt's Sx.   Pt was provided education on etiology of Sx with anatomy, physiology explanation with images along with the benefits of customized pelvic PT Tx based on pt's medical conditions and musculoskeletal deficits.  Explained the physiology of deep core mm coordination and roles of pelvic floor function in urination, defecation, sexual function, and postural control with deep core mm system.    Regional interdependent approaches will yield greater benefits in pt's POC due to the complexity of pt's medical Hx and the significant impact their Sx have had on their QOL.  Pt would benefit from a biopsychosocial approach to yield optimal outcomes. Plan to build interdisciplinary team with pt's providers to optimize patient-centered care.    Following Tx today which pt tolerated without complaints,  pt demo'd proper body mechanics to minimize straining pelvic floor.   Discussed plan to address stand<>floor for improved pelvic  girdle stability and calf stretch to help plantar fasciitis .     Addressed leg length difference with shoe lift in L shoe after which pt demo'd levelled pelvic girdle and shoulder and increased gait speed and less devaitions. Pt had no complaints with shoe lift. Explained role of posture, alignment of pelvis and spine with shoe lift to yield longer benefits for pelvis goals and helping plantar fasciitis.   Plan to reassess alignment of spine/ pelvis at next session to help promote optimize IAP system for improved pelvic floor function, trunk stability, gait, balance, stabilization with mobility tasks.  Plan to address pelvic floor issues once pelvis and spine are realigned to yield better outcomes.   Pt benefits from skilled PT.      OBJECTIVE IMPAIRMENTS decreased activity tolerance, decreased coordination, decreased endurance, decreased mobility, difficulty walking, decreased ROM, decreased strength, decreased safety awareness, hypomobility, increased muscle spasms, impaired flexibility, improper body mechanics, postural dysfunction, and scar restrictions   ACTIVITY LIMITATIONS  self-care,  sleep, home chores, work tasks    PARTICIPATION LIMITATIONS:  community, fitness activities    PERSONAL FACTORS        are also affecting patient's functional outcome.    REHAB POTENTIAL: Good   CLINICAL DECISION MAKING: Evolving/moderate complexity   EVALUATION COMPLEXITY: Moderate    PATIENT EDUCATION:    Education details: Showed pt anatomy images. Explained muscles attachments/ connection, physiology of deep core system/ spinal- thoracic-pelvis-lower kinetic chain as they relate to pt's presentation, Sx, and past Hx. Explained what and how these areas of deficits need to be restored to balance and function    See Therapeutic activity / neuromuscular re-education section  Answered pt's questions.   Person educated: Patient Education method: Explanation, Demonstration, Tactile cues, Verbal  cues, and Handouts Education comprehension: verbalized understanding, returned demonstration, verbal cues required, tactile cues required, and needs further education     PLAN: PT FREQUENCY: 1x/week   PT DURATION: 10 weeks   PLANNED INTERVENTIONS: Therapeutic exercises, Therapeutic activity, Neuromuscular re-education, Balance training, Gait training, Patient/Family education, Self Care, Joint mobilization, Spinal mobilization, Moist heat, Taping, and Manual therapy, dry needling.   PLAN FOR NEXT SESSION: See clinical impression for plan     GOALS: Goals reviewed with patient? Yes  SHORT TERM GOALS: Target date: 05/21/2024    Pt will demo IND with HEP                    Baseline: Not IND            Goal status: INITIAL   LONG TERM GOALS: Target date: 07/02/2024    1.Pt will demo proper deep core coordination without chest breathing and optimal excursion of diaphragm/pelvic floor in order to promote spinal stability and pelvic floor function  Baseline: dyscoordination Goal status: INITIAL  2.  Pt will demo proper body mechanics in against gravity tasks ( sitting, sit to stand, stand<>floor) and ADLs  work tasks, fitness (strengthening at home gym) to minimize straining pelvic floor / back    Baseline: not IND, improper form that places strain on pelvic floor  Goal status: INITIAL    3. Pt will demo increased gait speed >  1.3 m/s with reciprocal gait pattern, longer stride length  in order to ambulate safely in community and return to fitness routine  Baseline: 1.22 m/s ( R trunk lean, decerased stance on R, limited posterior rotationof R thorax ),   Goal status: INITIAL    4. Pt will demo levelled pelvic girdle and shoulder height in order to progress to deep core strengthening HEP and restore mobility at spine, pelvis, gait, posture minimize falls, and improve balance   Baseline: R Shoulder and iliac crest higher  Goal status: INITIAL   5. Pt will improve PFDI-7  questionnaire to  pts  score  to demo improved QOL  Baseline:    ( greater pts indicate greater negative impact on QOL)     57 pts  ( total)    48  pts  ( UIQ-7 )    0 pts  ( CRAIQ-7 )    0 pts  ( POPIQ-7 )  Goal Status: INITIAL       Pia Lupe Plump, PT 04/23/2024, 2:44 PM

## 2024-04-23 NOTE — Patient Instructions (Signed)
 Try shoe lift in L shoe and remove if causes discomfort  __  Avoid straining pelvic floor, abdominal muscles , spine  Use log rolling technique instead of getting out of bed with your neck or the sit-up   Log rolling into and out of bed  Log rolling into and out of bed If getting out of bed on R side, Bent knees, scoot hips/ shoulder to L  Raise R arm completely overhead, rolling onto armpit  Then lower bent knees to bed to get into complete side lying position  Then drop legs off bed, and push up onto R elbow/forearm, and use L hand to push onto the bed  __ Proper body mechanics with getting out of a chair to decrease strain  on back &pelvic floor   Avoid holding your breath when Getting out of the chair:  Scoot to front part of chair chair Heels behind knees, feet are hip width apart, nose over toes  Inhale like you are smelling roses Exhale to stand  ___  Sitting with feet on ground, four points of contact Catch yourself crossing ankles and thighs  __

## 2024-04-29 ENCOUNTER — Other Ambulatory Visit: Payer: Self-pay | Admitting: Neurology

## 2024-04-29 DIAGNOSIS — M6240 Contracture of muscle, unspecified site: Secondary | ICD-10-CM

## 2024-04-29 DIAGNOSIS — G514 Facial myokymia: Secondary | ICD-10-CM

## 2024-04-30 ENCOUNTER — Encounter: Payer: BC Managed Care – PPO | Admitting: Physical Therapy

## 2024-05-01 ENCOUNTER — Encounter: Admitting: Physical Therapy

## 2024-05-07 ENCOUNTER — Encounter: Payer: BC Managed Care – PPO | Admitting: Physical Therapy

## 2024-05-08 ENCOUNTER — Ambulatory Visit: Attending: Obstetrics and Gynecology | Admitting: Physical Therapy

## 2024-05-08 DIAGNOSIS — M2170 Unequal limb length (acquired), unspecified site: Secondary | ICD-10-CM | POA: Insufficient documentation

## 2024-05-08 DIAGNOSIS — R278 Other lack of coordination: Secondary | ICD-10-CM | POA: Insufficient documentation

## 2024-05-08 DIAGNOSIS — R2689 Other abnormalities of gait and mobility: Secondary | ICD-10-CM | POA: Insufficient documentation

## 2024-05-08 NOTE — Therapy (Signed)
 OUTPATIENT PHYSICAL THERAPY TREATMENT   Patient Name: Kelly Ortiz MRN: [id number] DOB:[birth year], 63 y.o., female Today's Date: 05/08/2024   PT End of Session - 05/08/24 1426     Visit Number 2    Number of Visits 10    Date for PT Re-Evaluation 07/02/24    Authorization Type 11 visit between 4/30- 7/28    Authorization - Visit Number 2    Authorization - Number of Visits 11    PT Start Time 1421    PT Stop Time 1500    PT Time Calculation (min) 39 min    Activity Tolerance Patient tolerated treatment well;No increased pain    Behavior During Therapy WFL for tasks assessed/performed             Past Medical History:  Diagnosis Date   Alopecia    Amenorrhea    Anxiety    BRCA negative 2014   2014 BRCA neg; 2017 MyRisk update testing neg   Fibroid    Hormone disorder    IBS (irritable bowel syndrome)    Pyelonephritis    Thyroid  disease    Past Surgical History:  Procedure Laterality Date   CESAREAN SECTION     COLONOSCOPY  2013   OPEN REDUCTION INTERNAL FIXATION (ORIF) CALCANEAL FRACTURE WITH FUSION     Patient Active Problem List   Diagnosis Date Noted   Age-related osteoporosis without current pathological fracture 05/08/2023   Family history of ovarian cancer 02/06/2022   Arthritis 06/13/2018   Anxiety 06/13/2018   Allergic rhinitis 06/13/2018   Hypothyroidism due to Hashimoto's thyroiditis 12/31/2015   Multiple thyroid  nodules 12/31/2015   Acquired hypothyroidism 12/28/2014    PCP: Lyle San , MD  REFERRING PROVIDER: [place]   REFERRING DIAG: N39.46 (ICD-10-CM) - Mixed incontinence urge and stress   Rationale for Evaluation and Treatment Rehabilitation  THERAPY DIAG:  Leg length discrepancy  Other lack of coordination  Other abnormalities of gait and mobility  ONSET DATE:   SUBJECTIVE:                   SUBJECTIVE STATEMENT TODAY:  Pt  has been wearing shoe lift in L shoe in toe box and heel without issues. Pt noticed plantar  fasciitis in the R foot only once the past 2 weeks. Today, she feels mild tinglingness in her L foot.   Pt has been practicing not crossing ankles nor slouching.                                                                                                                                                                           SUBJECTIVE STATEMENT ON EVAL 04/23/24 :  1)  urge and stress incontinence:  started before and after pregnancy 25 years ago, worsened with menopause. Pt had 2 C sections . Pt wears period underwear and it is rare to change midday       Activities causing leakage: sneezing, getting up of floor ,  sit up motion with rowing.    Hx of  sacroiliac pain which started in college when she was washing a large fabric in a deep sink and leaning. Pt goes to chiropractic Tx which helps keep the pain at bay. Two days, L side hurt 3/10 pain level. Non radiating. Eases quickly. Pain with sit to stand.    2) plantar fascitis B:  occurs  days per month,  3/10  occurs after walking in non athletic shoes.     PERTINENT HISTORY:   Fitness 1.5 miles in 20 min several days a week , stopping doing the rowing machine, improve balance / strength training at home   Osteoporosis, arthritis , 2 C sections   PAIN:  Are you having pain? Yes see above   PRECAUTIONS: None   WEIGHT BEARING RESTRICTIONS: No   FALLS:  Has patient fallen in last 6 months? No  LIVING ENVIRONMENT: Lives with: lives with their family Lives in: two story  home  Stairs:  2 STE   OCCUPATION: teacher  PLOF: IND   PATIENT GOALS:  Improve leakage  , being able to get off the floor, cough, sneezing , and use rowing machine with minimal and zero leakage    OBJECTIVE:   [facility] PT Assessment - 05/08/24 1439       AROM   Overall AROM Comments L DF 70 deg, R 80 deg, ( post Tx: 70 deg )      Strength   Overall Strength Comments single UE support, heel raises single leg, R MMT 5/5  14 reps, L 1 reps 3/5 with  immediate LOB ( post Tx: 7 reps on R)      Palpation   SI assessment  levelled pelvis and shoulder with L shoe lift in toe box and heel    Palpation comment hypomobile midfoot,, supination, high arches  tightness along plantar fascia B ( R>L), tight laeral, ant leg mm, tib-fib hypomobile             [facility] Adult PT Treatment/Exercise - 05/08/24 1731       Therapeutic Activites    Other Therapeutic Activities explained LKC mechanics fo gait  and balance      Neuro Re-ed    Neuro Re-ed Details  cued for gait mechanics and balancing with transverse arch to minimize plantar fasciitis / heel striking , cued for feet mobility HEP and knee / feet alignment      Manual Therapy   Manual therapy comments distraction. STM/MWM / PA / AP mob grade III at feet / leg to promote midfoot DF, toe abduction, lower arches, ER knee ,               HOME EXERCISE PROGRAM: See pt instruction section    ASSESSMENT:  CLINICAL IMPRESSION:   Pt's uneven pelvic girdle and shoulder height have been addressed with shoe lift in L shoe toe box and heel. Pt  has been wearing shoe lift in L shoe in toe box and heel without issues.     Pt was provided education on etiology of Sx with anatomy, physiology explanation with images to  explain feet mechanics and LKC impacting pelvic symptoms.    Regional interdependent approaches will yield greater  benefits in pt's POC due to the complexity of pt's medical Hx and the significant impact their Sx have had on their QOL. Pt would benefit from a biopsychosocial approach to yield optimal outcomes. Plan to build interdisciplinary team with pt's providers to optimize patient-centered care.    Following Tx today which pt tolerated without complaints,  pt gained more DF AROM on R foot and more toe abduction, DF/EV bilaterally which will help with plantar fasciitis and balance.   Plan to reassess LKC deficits at next session to help promote balance which pt demo'd poor  SLS.   Anticipate regional interdependence approach to treatments will optimize IAP system for improved pelvic floor function, trunk stability, gait, balance, stabilization with mobility tasks.  Plan to address pelvic floor issues once pelvis and spine are realigned to yield better outcomes.   Pt benefits from skilled PT.      OBJECTIVE IMPAIRMENTS decreased activity tolerance, decreased coordination, decreased endurance, decreased mobility, difficulty walking, decreased ROM, decreased strength, decreased safety awareness, hypomobility, increased muscle spasms, impaired flexibility, improper body mechanics, postural dysfunction, and scar restrictions   ACTIVITY LIMITATIONS  self-care,  sleep, home chores, work tasks    PARTICIPATION LIMITATIONS:  community, fitness activities    PERSONAL FACTORS        are also affecting patient's functional outcome.    REHAB POTENTIAL: Good   CLINICAL DECISION MAKING: Evolving/moderate complexity   EVALUATION COMPLEXITY: Moderate    PATIENT EDUCATION:    Education details: Showed pt anatomy images. Explained muscles attachments/ connection, physiology of deep core system/ spinal- thoracic-pelvis-lower kinetic chain as they relate to pt's presentation, Sx, and past Hx. Explained what and how these areas of deficits need to be restored to balance and function    See Therapeutic activity / neuromuscular re-education section  Answered pt's questions.   Person educated: Patient Education method: Explanation, Demonstration, Tactile cues, Verbal cues, and Handouts Education comprehension: verbalized understanding, returned demonstration, verbal cues required, tactile cues required, and needs further education     PLAN: PT FREQUENCY: 1x/week   PT DURATION: 10 weeks   PLANNED INTERVENTIONS: Therapeutic exercises, Therapeutic activity, Neuromuscular re-education, Balance training, Gait training, Patient/Family education, Self Care, Joint  mobilization, Spinal mobilization, Moist heat, Taping, and Manual therapy, dry needling.   PLAN FOR NEXT SESSION: See clinical impression for plan     GOALS: Goals reviewed with patient? Yes  SHORT TERM GOALS: Target date: 05/21/2024    Pt will demo IND with HEP                    Baseline: Not IND            Goal status: INITIAL   LONG TERM GOALS: Target date: 07/02/2024    1.Pt will demo proper deep core coordination without chest breathing and optimal excursion of diaphragm/pelvic floor in order to promote spinal stability and pelvic floor function  Baseline: dyscoordination Goal status: INITIAL  2.  Pt will demo proper body mechanics in against gravity tasks ( sitting, sit to stand, stand<>floor) and ADLs  work tasks, fitness (strengthening at home gym) to minimize straining pelvic floor / back    Baseline: not IND, improper form that places strain on pelvic floor  Goal status: INITIAL    3. Pt will demo increased gait speed > 1.3 m/s with reciprocal gait pattern, longer stride length  in order to ambulate safely in community and return to fitness routine  Baseline: 1.22 m/s ( R trunk lean, decerased  stance on R, limited posterior rotationof R thorax ),   Goal status: INITIAL    4. Pt will demo levelled pelvic girdle and shoulder height in order to progress to deep core strengthening HEP and restore mobility at spine, pelvis, gait, posture minimize falls, and improve balance   Baseline: R Shoulder and iliac crest higher  Goal status: INITIAL   5. Pt will improve PFDI-7 questionnaire to  pts  score  to demo improved QOL  Baseline:    ( greater pts indicate greater negative impact on QOL)     57 pts  ( total)    48  pts  ( UIQ-7 )    0 pts  ( CRAIQ-7 )    0 pts  ( POPIQ-7 )  Goal Status: INITIAL       [place] Andreas, PT 05/08/2024, 2:27 PM

## 2024-05-08 NOTE — Patient Instructions (Addendum)
  Proper body mechanics with getting out of a chair to decrease strain  on back &pelvic floor   Avoid holding your breath when Getting out of the chair:  Scoot to front part of chair chair Heels behind knees, feet are hip width apart, nose over toes  Inhale like you are smelling roses Exhale to stand    __     Feet slides :   Points of contact at sitting bones  Four points of contact of foot,  Side knee back while keeping knee out along 2-3rd toe line   Heel up, ankle not twist out Lower heel while keeping knee out along 2-3rd toe line Four points of contact of foot, Slide foot back while keeping knee out along 2-3rd toe line   Repeated with other foot  2 min  ___   walking with higher knees, strike across ballmounds,  longer strides,    Marching in place , higher knees, toe spread , foot under width of hips

## 2024-05-09 ENCOUNTER — Ambulatory Visit
Admission: RE | Admit: 2024-05-09 | Discharge: 2024-05-09 | Disposition: A | Discharge status: Home / Self Care | Source: Ambulatory Visit | Attending: Neurology | Admitting: Neurology

## 2024-05-09 DIAGNOSIS — M6240 Contracture of muscle, unspecified site: Secondary | ICD-10-CM

## 2024-05-09 DIAGNOSIS — G514 Facial myokymia: Secondary | ICD-10-CM

## 2024-05-11 NOTE — Progress Notes (Signed)
 Chief Complaint  Patient presents with   Gynecologic Exam    No concerns     HPI:      [name] is a 63 y.o. G2P2002 who LMP was No LMP recorded. Patient is postmenopausal., presents today for her annual examination.   Her menses are absent due to menopause. She does not have PMB/pelvic pain.   She has rare vasomotor sx. She uses estradiol and progesterone with sx relief. She is on biest 3% 70/30 crm 0.1 ml QOD and prog 150 mg nightly for 6 nights, 1 night off. She denies any side effects and wants to cont meds. Has occas night sweats if not good about using ERT. Rx RF goes to [facility].   Sex activity: single partner, contraception - post menopausal status. She does have vaginal dryness and uses lubricants with sx relief. No pain/bleeding.   Last Pap: 05/08/23  Results were: no abnormalities /neg HPV DNA.  Hx of STDs: none   Last mammogram: 11/30/23 Results were: normal--routine follow-up in 12 months There is no FH of breast cancer. There is a FH of ovarian cancer in her mom. Pt was BRCA neg 2014 and My Risk neg 2017. Pt aware no great screening recommendations. Had done GYN u/s and ca-125 in past and neg. The patient does do self-breast exams.    Colonoscopy: colonoscopy 2024 without abnormalities. Repeat due after 10 yrs. Hx of constipation.  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 On ERT/alendronate weekly, followed by Dr. Lorelei Rogers; spine improved 2025. Taking ca/Vit D supp.    Tobacco use: None Alcohol use: social drinker No drug use Exercise: mod active   She does get adequate calcium and Vitamin D in her diet.   She has labs with PCP. Hx of occas SUI/urge incontinence, getting worse last yr with activity; referred to pelvic PT and just started seeing her.    Past Medical History:  Diagnosis Date   Alopecia    Amenorrhea    Anxiety    BRCA negative 2014   2014 BRCA neg; 2017 MyRisk update testing neg    Fibroid    Hormone disorder    IBS (irritable bowel syndrome)    Pyelonephritis    Thyroid  disease     Past Surgical History:  Procedure Laterality Date   CESAREAN SECTION     COLONOSCOPY  2013   OPEN REDUCTION INTERNAL FIXATION (ORIF) CALCANEAL FRACTURE WITH FUSION      Family History  Problem Relation Age of Onset   Rheum arthritis Mother    Osteoporosis Mother    Ovarian cancer Mother    Cancer Mother 11       ovarian/uterine    Coronary artery disease Father    Diabetes Father    Coronary artery disease Maternal Grandfather    Hyperlipidemia Maternal Grandfather    Coronary artery disease Paternal Grandmother    Diabetes Paternal Grandmother    Heart failure Paternal Grandfather    Breast cancer Neg Hx     Social History   Socioeconomic History   Marital status: Married    Spouse name: Not on file   Number of children: Not on file   Years of education: Not on file   Highest education level: Not on file  Occupational History   Not on file  Tobacco Use   Smoking status: Never   Smokeless tobacco: Never  Vaping Use   Vaping status: Never  Used  Substance and Sexual Activity   Alcohol use: Yes    Comment: occaisonal   Drug use: No   Sexual activity: Yes    Birth control/protection: Post-menopausal  Other Topics Concern   Not on file  Social History Narrative   Not on file   Social Drivers of Health   Financial Resource Strain: Patient Declined (10/03/2023)   Received from [facility]   Overall Financial Resource Strain (CARDIA)    Difficulty of Paying Living Expenses: Patient declined  Food Insecurity: Patient Declined (10/03/2023)   Received from [facility]   Hunger Vital Sign    Worried About Running Out of Food in the Last Year: Patient declined    Ran Out of Food in the Last Year: Patient declined  Transportation Needs: Patient Declined (10/03/2023)   Received from [facility] -  Transportation    In the past 12 months, has lack of transportation kept you from medical appointments or from getting medications?: Patient declined    Lack of Transportation (Non-Medical): Patient declined  Physical Activity: Not on file  Stress: Not on file  Social Connections: Not on file  Intimate Partner Violence: Not on file    Current Outpatient Medications on File Prior to Visit  Medication Sig Dispense Refill   alendronate (FOSAMAX) 70 MG tablet Take 70 mg by mouth once a week. Take with a full glass of water on an empty stomach.     aspirin EC 81 MG tablet Take by mouth.     Biotin 2.5 MG CAPS Take by mouth.     Calcium Carb-Cholecalciferol 600-10 MG-MCG TABS Take 1 tablet by mouth.     CALCIUM PO Take by mouth.     Cholecalciferol 25 MCG (1000 UT) tablet Take by mouth.     Cyanocobalamin 2500 MCG CHEW Chew by mouth.     Magnesium Citrate 100 MG TABS Take by mouth.     Propylene Glycol (VISTA MEIBO TEARS) 0.6 % SOLN      RYALTRIS 665-25 MCG/ACT SUSP SMARTSIG:1 Spray(s) Both Nares Every 12 Hours PRN     Thyroid  (LEVOTHYROXINE-LIOTHYRONINE) 60 MG TABS Take by mouth. Take one tablet by mouth on Mon, tues,thur,fridays, sat     triamcinolone cream (KENALOG) 0.1 % as needed.     Zinc 30 MG TABS Take by mouth.     No current facility-administered medications on file prior to visit.      ROS:  Review of Systems  Constitutional:  Negative for fatigue, fever and unexpected weight change.  Respiratory:  Negative for cough, shortness of breath and wheezing.   Cardiovascular:  Negative for chest pain, palpitations and leg swelling.  Gastrointestinal:  Negative for blood in stool, constipation, diarrhea, nausea and vomiting.  Endocrine: Negative for cold intolerance, heat intolerance and polyuria.  Genitourinary:  Negative for dyspareunia, dysuria, flank pain, frequency, genital sores, hematuria, menstrual problem, pelvic pain, urgency, vaginal bleeding, vaginal discharge and  vaginal pain.  Musculoskeletal:  Negative for back pain, joint swelling and myalgias.  Skin:  Negative for rash.  Neurological:  Negative for dizziness, syncope, light-headedness, numbness and headaches.  Hematological:  Negative for adenopathy.  Psychiatric/Behavioral:  Negative for agitation, confusion, dysphoric mood, sleep disturbance and suicidal ideas. The patient is not nervous/anxious.      Objective: BP (!) 96/59   Pulse 75   Ht 5\' 2"  (1.575 m)   Wt 135 lb (61.2 kg)   BMI 24.69 kg/m  Physical Exam Constitutional:      Appearance: She is well-developed.  Genitourinary:     Vulva normal.     Right Labia: No rash, tenderness or lesions.    Left Labia: No tenderness, lesions or rash.    No vaginal discharge, erythema or tenderness.      Right Adnexa: not tender and no mass present.    Left Adnexa: not tender and no mass present.    No cervical friability or polyp.     Uterus is not enlarged or tender.  Breasts:    Right: No mass, nipple discharge, skin change or tenderness.     Left: No mass, nipple discharge, skin change or tenderness.  Neck:     Thyroid : No thyromegaly.  Cardiovascular:     Rate and Rhythm: Normal rate and regular rhythm.     Heart sounds: Normal heart sounds. No murmur heard. Pulmonary:     Effort: Pulmonary effort is normal.     Breath sounds: Normal breath sounds.  Abdominal:     Palpations: Abdomen is soft.     Tenderness: There is no abdominal tenderness. There is no guarding or rebound.  Musculoskeletal:        General: Normal range of motion.     Cervical back: Normal range of motion.  Lymphadenopathy:     Cervical: No cervical adenopathy.  Neurological:     General: No focal deficit present.     Mental Status: She is alert and oriented to person, place, and time.     Cranial Nerves: No cranial nerve deficit.  Skin:    General: Skin is warm and dry.  Psychiatric:        Mood and Affect: Mood normal.        Behavior: Behavior  normal.        Thought Content: Thought content normal.        Judgment: Judgment normal.  Vitals reviewed.     Assessment/Plan: Encounter for annual routine gynecological examination  Encounter for screening mammogram for malignant neoplasm of breast - Plan: MM 3D SCREENING MAMMOGRAM BILATERAL BREAST; pt to schedule mammo 12/25  Family history of ovarian cancer--pt is MyRisk neg; no increased screening recommendations  Hormone replacement therapy (HRT) - Plan: AMBULATORY NON FORMULARY MEDICATION, AMBULATORY NON FORMULARY MEDICATION  Vasomotor symptoms due to menopause - Plan: AMBULATORY NON FORMULARY MEDICATION, AMBULATORY NON FORMULARY MEDICATION; Rx RF sent to [facility]. Decrease prog to 100 mg daily given low dose ERT. F/u prn.   Mixed incontinence urge and stress--seeing pelvic PT now.     Meds ordered this encounter  Medications   AMBULATORY NON FORMULARY MEDICATION    Sig: Progesterone oral 100 mg Take 1 capsule daily 6 days on, 1 day off  NOTE DOSE CHANGE    Dispense:  90 capsule    Refill:  3    Supervising Provider:   ROBY, MICIA [[id number]]   AMBULATORY NON FORMULARY MEDICATION    Sig: Biest 3% 70/30 cream Apply 0.1 ml daily to QOD prn sx, dispense 3 ml syringes vs pump    Dispense:  5 mL    Refill:  3    Supervising Provider:   ROBY, MICIA [[id number]]             GYN counsel breast self exam, mammography screening, use and side effects of HRT, menopause, adequate intake of calcium and vitamin D, diet and exercise     F/U  Return in about 1 year (around 05/12/2025).  Daurice Ovando B.  Danney Bungert, PA-C 05/12/2024 4:57 PM

## 2024-05-12 ENCOUNTER — Encounter: Payer: Self-pay | Admitting: Obstetrics and Gynecology

## 2024-05-12 ENCOUNTER — Ambulatory Visit: Admitting: Obstetrics and Gynecology

## 2024-05-12 VITALS — BP 96/59 | HR 75 | Ht 62.0 in | Wt 135.0 lb

## 2024-05-12 DIAGNOSIS — Z01419 Encounter for gynecological examination (general) (routine) without abnormal findings: Secondary | ICD-10-CM | POA: Diagnosis not present

## 2024-05-12 DIAGNOSIS — Z8041 Family history of malignant neoplasm of ovary: Secondary | ICD-10-CM

## 2024-05-12 DIAGNOSIS — N951 Menopausal and female climacteric states: Secondary | ICD-10-CM

## 2024-05-12 DIAGNOSIS — Z79890 Hormone replacement therapy (postmenopausal): Secondary | ICD-10-CM

## 2024-05-12 DIAGNOSIS — N3946 Mixed incontinence: Secondary | ICD-10-CM

## 2024-05-12 DIAGNOSIS — Z1231 Encounter for screening mammogram for malignant neoplasm of breast: Secondary | ICD-10-CM

## 2024-05-12 MED ORDER — AMBULATORY NON FORMULARY MEDICATION: 3 refills | Status: AC

## 2024-05-12 NOTE — Patient Instructions (Signed)
 I value your feedback and you entrusting [place] with your care. If you get a [place] patient survey, I would appreciate you taking the time to let us know about your experience today. Thank you! ? ? ?

## 2024-05-28 ENCOUNTER — Ambulatory Visit: Payer: BC Managed Care – PPO | Attending: Obstetrics and Gynecology | Admitting: Physical Therapy

## 2024-05-28 DIAGNOSIS — R2689 Other abnormalities of gait and mobility: Secondary | ICD-10-CM | POA: Insufficient documentation

## 2024-05-28 DIAGNOSIS — M533 Sacrococcygeal disorders, not elsewhere classified: Secondary | ICD-10-CM | POA: Diagnosis not present

## 2024-05-28 DIAGNOSIS — R278 Other lack of coordination: Secondary | ICD-10-CM | POA: Diagnosis present

## 2024-05-28 DIAGNOSIS — M2170 Unequal limb length (acquired), unspecified site: Secondary | ICD-10-CM | POA: Diagnosis present

## 2024-05-28 NOTE — Patient Instructions (Signed)
 Walking with higher knees, landing on ballmounds , push off   __   Feet care :  Self -feet massage   Handshake : fingers between toes, moving ballmounds/toes back and forth several times while other hand anchors at arch. Do the same at the hind/mid foot.  Heel to toes upward to a letter Big Letter T strokes to spread ballmounds and toes, several times, pinch between webs of toes  Run finger tips along top of foot between long bones "comb between the bones"    Wiggle toes and spread them out when relaxing    __

## 2024-05-28 NOTE — Therapy (Addendum)
 OUTPATIENT PHYSICAL THERAPY TREATMENT   Patient Name: Kelly Ortiz MRN: [id number] DOB:21-Feb-1961, 63 y.o., female Today's Date: 05/28/2024   PT End of Session - 05/28/24 1421     Visit Number 3    Number of Visits 10    Date for PT Re-Evaluation 07/02/24    Authorization Type 11 visit between 4/30- 7/28    Authorization - Visit Number 3    Authorization - Number of Visits 11    PT Start Time 1420    PT Stop Time 1500    PT Time Calculation (min) 40 min    Activity Tolerance Patient tolerated treatment well;No increased pain    Behavior During Therapy WFL for tasks assessed/performed             Past Medical History:  Diagnosis Date   Alopecia    Amenorrhea    Anxiety    BRCA negative 2014   2014 BRCA neg; 2017 MyRisk update testing neg   Fibroid    Hormone disorder    IBS (irritable bowel syndrome)    Pyelonephritis    Thyroid  disease    Past Surgical History:  Procedure Laterality Date   CESAREAN SECTION     COLONOSCOPY  2013   OPEN REDUCTION INTERNAL FIXATION (ORIF) CALCANEAL FRACTURE WITH FUSION     Patient Active Problem List   Diagnosis Date Noted   Age-related osteoporosis without current pathological fracture 05/08/2023   Family history of ovarian cancer 02/06/2022   Arthritis 06/13/2018   Anxiety 06/13/2018   Allergic rhinitis 06/13/2018   Hypothyroidism due to Hashimoto's thyroiditis 12/31/2015   Multiple thyroid  nodules 12/31/2015   Acquired hypothyroidism 12/28/2014    PCP: Lyle San , MD  REFERRING PROVIDER: [place]   REFERRING DIAG: N39.46 (ICD-10-CM) - Mixed incontinence urge and stress   Rationale for Evaluation and Treatment Rehabilitation  THERAPY DIAG:  Leg length discrepancy  Other lack of coordination  Other abnormalities of gait and mobility  ONSET DATE:   SUBJECTIVE:                   SUBJECTIVE STATEMENT TODAY:  Pt no longer has tingling in L foot Plantar fascitiis decreased from 3/10 to 1/10  Pt has bought  new shoes with wider toe box and uses her lifts on L shoe toe box and heel                                                                                                                                                                           SUBJECTIVE STATEMENT ON EVAL 04/23/24 :  1)  urge and stress incontinence:  started before and after pregnancy 25 years ago, worsened with menopause. Pt had 2  C sections . Pt wears period underwear and it is rare to change midday       Activities causing leakage: sneezing, getting up of floor ,  sit up motion with rowing.    Hx of  sacroiliac pain which started in college when she was washing a large fabric in a deep sink and leaning. Pt goes to chiropractic Tx which helps keep the pain at bay. Two days, L side hurt 3/10 pain level. Non radiating. Eases quickly. Pain with sit to stand.    2) plantar fascitis B:  occurs  days per month,  3/10  occurs after walking in non athletic shoes.     PERTINENT HISTORY:   Fitness 1.5 miles in 20 min several days a week , stopping doing the rowing machine, improve balance / strength training at home   Osteoporosis, arthritis , 2 C sections   PAIN:  Are you having pain? Yes see above   PRECAUTIONS: None   WEIGHT BEARING RESTRICTIONS: No   FALLS:  Has patient fallen in last 6 months? No  LIVING ENVIRONMENT: Lives with: lives with their family Lives in: two story  home  Stairs:  2 STE   OCCUPATION: teacher  PLOF: IND   PATIENT GOALS:  Improve leakage  , being able to get off the floor, cough, sneezing , and use rowing machine with minimal and zero leakage    OBJECTIVE:   [facility] PT Assessment - 05/28/24 1452       AROM   Overall AROM Comments B DF 70 deg ,  R big hallus valgus noted still      Strength   Overall Strength Comments single UE support, heel raises single leg, R MMT 5/5  13 reps, L 20 reps      Palpation   SI assessment  levelled pelvis and shoulder with L shoe lift in toe box and  heel    Palpation comment hypomobile planta fascia, intermediate /lateral cuneiform, dorsal interreous between ray I-II R             [facility] Adult PT Treatment/Exercise - 05/28/24 1457       Neuro Re-ed    Neuro Re-ed Details  cued for gait mechanics with more tranversear arch and deep core core-activation      Manual Therapy   Manual therapy comments PA/AP mob and STM/MWM at problem areas noted in assessment to promote toe abduction / toe extension               HOME EXERCISE PROGRAM: See pt instruction section    ASSESSMENT:  CLINICAL IMPRESSION:    Improvements at 3rd visit:  Pt no longer has tingling in L foot Plantar fascitiis decreased from 3/10 to 1/10  Pt has bought new shoes with wider toe box and uses her lifts on L shoe toe box and heel   even pelvic girdle and shoulder height Improved DF AROM on R and improved  heel raise reps on B feet and SLS balance    Further addressed R foot hypombility and provided cues for gait training ( more longer stride and stronger push off with transverse arch co-activation)  Plan to progress to deep core trianing next session now that pelvic and spine are aligned for the past 2 visits with L shoe lifts in toe box and heel.   Anticipate regional interdependence approach to treatments will optimize IAP system for improved pelvic floor function, trunk stability, gait, balance, stabilization with mobility tasks.  Plan  to address pelvic floor issues once pelvis and spine are realigned to yield better outcomes.   Pt benefits from skilled PT.      OBJECTIVE IMPAIRMENTS decreased activity tolerance, decreased coordination, decreased endurance, decreased mobility, difficulty walking, decreased ROM, decreased strength, decreased safety awareness, hypomobility, increased muscle spasms, impaired flexibility, improper body mechanics, postural dysfunction, and scar restrictions   ACTIVITY LIMITATIONS  self-care,  sleep, home chores,  work tasks    PARTICIPATION LIMITATIONS:  community, fitness activities    PERSONAL FACTORS        are also affecting patient's functional outcome.    REHAB POTENTIAL: Good   CLINICAL DECISION MAKING: Evolving/moderate complexity   EVALUATION COMPLEXITY: Moderate    PATIENT EDUCATION:    Education details: Showed pt anatomy images. Explained muscles attachments/ connection, physiology of deep core system/ spinal- thoracic-pelvis-lower kinetic chain as they relate to pt's presentation, Sx, and past Hx. Explained what and how these areas of deficits need to be restored to balance and function    See Therapeutic activity / neuromuscular re-education section  Answered pt's questions.   Person educated: Patient Education method: Explanation, Demonstration, Tactile cues, Verbal cues, and Handouts Education comprehension: verbalized understanding, returned demonstration, verbal cues required, tactile cues required, and needs further education     PLAN: PT FREQUENCY: 1x/week   PT DURATION: 10 weeks   PLANNED INTERVENTIONS: Therapeutic exercises, Therapeutic activity, Neuromuscular re-education, Balance training, Gait training, Patient/Family education, Self Care, Joint mobilization, Spinal mobilization, Moist heat, Taping, and Manual therapy, dry needling.   PLAN FOR NEXT SESSION: See clinical impression for plan     GOALS: Goals reviewed with patient? Yes  SHORT TERM GOALS: Target date: 05/21/2024    Pt will demo IND with HEP                    Baseline: Not IND            Goal status: INITIAL   LONG TERM GOALS: Target date: 07/02/2024    1.Pt will demo proper deep core coordination without chest breathing and optimal excursion of diaphragm/pelvic floor in order to promote spinal stability and pelvic floor function  Baseline: dyscoordination Goal status: INITIAL  2.  Pt will demo proper body mechanics in against gravity tasks ( sitting, sit to stand, stand<>floor) and  ADLs  work tasks, fitness (strengthening at home gym) to minimize straining pelvic floor / back    Baseline: not IND, improper form that places strain on pelvic floor  Goal status: INITIAL    3. Pt will demo increased gait speed > 1.3 m/s with reciprocal gait pattern, longer stride length  in order to ambulate safely in community and return to fitness routine  Baseline: 1.22 m/s ( R trunk lean, decerased stance on R, limited posterior rotationof R thorax ),   Goal status: INITIAL    4. Pt will demo levelled pelvic girdle and shoulder height in order to progress to deep core strengthening HEP and restore mobility at spine, pelvis, gait, posture minimize falls, and improve balance   Baseline: R Shoulder and iliac crest higher  Goal status: INITIAL   5. Pt will improve PFDI-7 questionnaire to  pts  score  to demo improved QOL  Baseline:    ( greater pts indicate greater negative impact on QOL)     57 pts  ( total)    48  pts  ( UIQ-7 )    0 pts  ( CRAIQ-7 )    0  pts  ( POPIQ-7 )  Goal Status: INITIAL       Modesto Andreas, PT 05/28/2024, 3:00 PM

## 2024-06-04 ENCOUNTER — Ambulatory Visit: Payer: BC Managed Care – PPO | Admitting: Physical Therapy

## 2024-06-04 DIAGNOSIS — R2689 Other abnormalities of gait and mobility: Secondary | ICD-10-CM

## 2024-06-04 DIAGNOSIS — R278 Other lack of coordination: Secondary | ICD-10-CM

## 2024-06-04 DIAGNOSIS — M2170 Unequal limb length (acquired), unspecified site: Secondary | ICD-10-CM

## 2024-06-04 NOTE — Patient Instructions (Addendum)
 Strengthening feet    Heel raises - heels together, minisquat Hold onto counter   Minisquat motion, trunk bent , gaze onto floor like you are looking at your reflection over a lake/pond,  Knees bent pointed out like a v , navel ( center of mass) more forward  Heels together as you lift, pointed out like a v  KNEES ARE ALIGNED BEHIND THE TOES TO MINIMIZE STRAIN ON THE KNEES your  navel ( center of mass) more forward to a avoid dropping down fast and rocking more weight back onto heels , keep heels pressing against each other the whole time   30 reps   _________  ZigZag stretch  Reclined twist for hips and side of the hips/ legs  Lay on your back, knees bend, dig elbows and feet into bed to exhale and lift buttocks up to  Scoot hips to the L , leave shoulders in place Wobble knees to the R side 45 deg and to midline  10 reps    Grab L thigh with R  arm , move towards R armpit  ___    Hip abductor and hip flexors stretch at the same time    Alternative if hip is tight,   Sit at 45 deg turn with R leg and knee on edge of chair/ bench, L buttock hanging off the edge to bring the L foot back like a lunge, toes bent, lower heel to feel quad stretch,  pay attention to keeping pinky and first toe ballmound planted to align toes forward so ankles are not twerked   Repeat with other side   ___

## 2024-06-04 NOTE — Therapy (Signed)
 OUTPATIENT PHYSICAL THERAPY TREATMENT   Patient Name: SAIDE LANUZA MRN: [id number] DOB:[birth year], 63 y.o., female Today's Date: 06/04/2024   PT End of Session - 06/04/24 1425     Visit Number 4    Number of Visits 10    Date for PT Re-Evaluation 07/02/24    Authorization Type 11 visit between 4/30- 7/28    Authorization - Number of Visits 11    PT Start Time 1416    PT Stop Time 1520    PT Time Calculation (min) 64 min    Activity Tolerance Patient tolerated treatment well;No increased pain    Behavior During Therapy WFL for tasks assessed/performed             Past Medical History:  Diagnosis Date   Alopecia    Amenorrhea    Anxiety    BRCA negative 2014   2014 BRCA neg; 2017 MyRisk update testing neg   Fibroid    Hormone disorder    IBS (irritable bowel syndrome)    Pyelonephritis    Thyroid  disease    Past Surgical History:  Procedure Laterality Date   CESAREAN SECTION     COLONOSCOPY  2013   OPEN REDUCTION INTERNAL FIXATION (ORIF) CALCANEAL FRACTURE WITH FUSION     Patient Active Problem List   Diagnosis Date Noted   Age-related osteoporosis without current pathological fracture 05/08/2023   Family history of ovarian cancer 02/06/2022   Arthritis 06/13/2018   Anxiety 06/13/2018   Allergic rhinitis 06/13/2018   Hypothyroidism due to Hashimoto's thyroiditis 12/31/2015   Multiple thyroid  nodules 12/31/2015   Acquired hypothyroidism 12/28/2014    PCP: Lyle San , MD  REFERRING PROVIDER: [place]   REFERRING DIAG: N39.46 (ICD-10-CM) - Mixed incontinence urge and stress   Rationale for Evaluation and Treatment Rehabilitation  THERAPY DIAG:  Leg length discrepancy  Other lack of coordination  Other abnormalities of gait and mobility  ONSET DATE:   SUBJECTIVE:                   SUBJECTIVE STATEMENT TODAY:  Pt reports doing her exercises , does prefer massage bottom of feet more than top of feet                                                                                                         SUBJECTIVE STATEMENT ON EVAL 04/23/24 :  1)  urge and stress incontinence:  started before and after pregnancy 25 years ago, worsened with menopause. Pt had 2 C sections . Pt wears period underwear and it is rare to change midday       Activities causing leakage: sneezing, getting up of floor ,  sit up motion with rowing.    Hx of  sacroiliac pain which started in college when she was washing a large fabric in a deep sink and leaning. Pt goes to chiropractic Tx which helps keep the pain at bay. Two days, L side hurt 3/10 pain level. Non radiating. Eases quickly. Pain with sit to stand.    2) plantar  fascitis B:  occurs  days per month,  3/10  occurs after walking in non athletic shoes.     PERTINENT HISTORY:   Fitness 1.5 miles in 20 min several days a week , stopping doing the rowing machine, improve balance / strength training at home   Osteoporosis, arthritis , 2 C sections   PAIN:  Are you having pain? Yes see above   PRECAUTIONS: None   WEIGHT BEARING RESTRICTIONS: No   FALLS:  Has patient fallen in last 6 months? No  LIVING ENVIRONMENT: Lives with: lives with their family Lives in: two story  home  Stairs:  2 STE   OCCUPATION: teacher  PLOF: IND   PATIENT GOALS:  Improve leakage  , being able to get off the floor, cough, sneezing , and use rowing machine with minimal and zero leakage    OBJECTIVE:   [facility] PT Assessment - 06/04/24 1535       Squat   Comments difficulty with propioception in minisquat/ heel press exercise with UE support      Strength   Overall Strength Comments single UE support, heel raises single leg, R MMT 5/5 R  20 reps      Palpation   Palpation comment hypomobile lateral cuneiform/ cuboid,  planta fascia between ray III-IV R, hypomobile tib-fib, hamstring attachment at ischial tuberosity             [facility] Adult PT Treatment/Exercise - 06/04/24 1537       Therapeutic  Activites    Other Therapeutic Activities explained difference her self-selected yoga posture compred to modified pigeon on edge of chair to promote multiple areas of her LKC where she has deficits ,  explained use of figure-4 stretch with don/doff shoes to minimize spinal flexion/ hunched shoulders      Neuro Re-ed    Neuro Re-ed Details  excessive cues for minisquat -heel press with UE support , cued for propioception of pelvis in anterior tilt , less toe flexion      Manual Therapy   Manual therapy comments PA/AP mob and STM/MWM at problem areas noted in assessment to promote toe abduction / toe extension, DF/EV, ER of femur, mobility at R SIJ               HOME EXERCISE PROGRAM: See pt instruction section    ASSESSMENT:  CLINICAL IMPRESSION:    Improvements at 4th  visit:  Pt no longer has tingling in L foot Plantar fascitiis decreased from 3/10 to 1/10  Pt has bought new shoes with wider toe box and uses her lifts on L shoe toe box and heel   even pelvic girdle and shoulder height Improved DF AROM on R   Increased R heel raise reps to 20 reps , equal to L  with UE support   Further addressed R foot hypombility related to knee and SIJ.  Prescribed stretches and functional positions to further promote these improvements for ER of R femur/knee, DF/EV.  Excessive cues provided for minisquat -heel press with UE support , cued for propioception of pelvis in anterior tilt , less toe flexion Explained difference her self-selected yoga posture compred to modified pigeon on edge of chair to promote multiple areas of her [facility] where she has deficits ,  explained use of figure-4 stretch with don/doff shoes to minimize spinal flexion/ hunched shoulders  Plan to progress to deep core trianing next session.   Anticipate regional interdependence approach to treatments will optimize IAP system for  improved pelvic floor function, trunk stability, gait, balance, stabilization with  mobility tasks.  Plan to address pelvic floor issues once pelvis and spine are realigned to yield better outcomes.   Pt benefits from skilled PT.      OBJECTIVE IMPAIRMENTS decreased activity tolerance, decreased coordination, decreased endurance, decreased mobility, difficulty walking, decreased ROM, decreased strength, decreased safety awareness, hypomobility, increased muscle spasms, impaired flexibility, improper body mechanics, postural dysfunction, and scar restrictions   ACTIVITY LIMITATIONS  self-care,  sleep, home chores, work tasks    PARTICIPATION LIMITATIONS:  community, fitness activities    PERSONAL FACTORS        are also affecting patient's functional outcome.    REHAB POTENTIAL: Good   CLINICAL DECISION MAKING: Evolving/moderate complexity   EVALUATION COMPLEXITY: Moderate    PATIENT EDUCATION:    Education details: Showed pt anatomy images. Explained muscles attachments/ connection, physiology of deep core system/ spinal- thoracic-pelvis-lower kinetic chain as they relate to pt's presentation, Sx, and past Hx. Explained what and how these areas of deficits need to be restored to balance and function    See Therapeutic activity / neuromuscular re-education section  Answered pt's questions.   Person educated: Patient Education method: Explanation, Demonstration, Tactile cues, Verbal cues, and Handouts Education comprehension: verbalized understanding, returned demonstration, verbal cues required, tactile cues required, and needs further education     PLAN: PT FREQUENCY: 1x/week   PT DURATION: 10 weeks   PLANNED INTERVENTIONS: Therapeutic exercises, Therapeutic activity, Neuromuscular re-education, Balance training, Gait training, Patient/Family education, Self Care, Joint mobilization, Spinal mobilization, Moist heat, Taping, and Manual therapy, dry needling.   PLAN FOR NEXT SESSION: See clinical impression for plan     GOALS: Goals reviewed with  patient? Yes  SHORT TERM GOALS: Target date: 05/21/2024    Pt will demo IND with HEP                    Baseline: Not IND            Goal status: INITIAL   LONG TERM GOALS: Target date: 07/02/2024    1.Pt will demo proper deep core coordination without chest breathing and optimal excursion of diaphragm/pelvic floor in order to promote spinal stability and pelvic floor function  Baseline: dyscoordination Goal status: INITIAL  2.  Pt will demo proper body mechanics in against gravity tasks ( sitting, sit to stand, stand<>floor) and ADLs  work tasks, fitness (strengthening at home gym) to minimize straining pelvic floor / back    Baseline: not IND, improper form that places strain on pelvic floor  Goal status: INITIAL    3. Pt will demo increased gait speed > 1.3 m/s with reciprocal gait pattern, longer stride length  in order to ambulate safely in community and return to fitness routine  Baseline: 1.22 m/s ( R trunk lean, decerased stance on R, limited posterior rotationof R thorax ),   Goal status: INITIAL    4. Pt will demo levelled pelvic girdle and shoulder height in order to progress to deep core strengthening HEP and restore mobility at spine, pelvis, gait, posture minimize falls, and improve balance   Baseline: R Shoulder and iliac crest higher  Goal status: INITIAL   5. Pt will improve PFDI-7 questionnaire to  pts  score  to demo improved QOL  Baseline:    ( greater pts indicate greater negative impact on QOL)     57 pts  ( total)    48  pts  ( UIQ-7 )  0 pts  ( CRAIQ-7 )    0 pts  ( POPIQ-7 )  Goal Status: INITIAL       Modesto Andreas, PT 06/04/2024, 3:40 PM

## 2024-06-11 ENCOUNTER — Ambulatory Visit: Payer: BC Managed Care – PPO | Admitting: Physical Therapy

## 2024-06-12 ENCOUNTER — Ambulatory Visit: Admitting: Physical Therapy

## 2024-06-12 DIAGNOSIS — R2689 Other abnormalities of gait and mobility: Secondary | ICD-10-CM

## 2024-06-12 DIAGNOSIS — R278 Other lack of coordination: Secondary | ICD-10-CM

## 2024-06-12 DIAGNOSIS — M2170 Unequal limb length (acquired), unspecified site: Secondary | ICD-10-CM | POA: Diagnosis not present

## 2024-06-12 NOTE — Patient Instructions (Signed)
 Stretch for pelvic floor     Mermaid stretch  Rocking while seated on the floor with heels to one side of the hip Heels to one side of the hip  Rock forward towards the knee that is bent , rock beck towards the opposite sitting bones  ___ Sit to stand:  - point toes/ heels slightly V  - spread toes, weight shift over ballmounds   Nose over toes  Smell soup Exhale, stand on ballmounds and heels

## 2024-06-12 NOTE — Therapy (Addendum)
 OUTPATIENT PHYSICAL THERAPY TREATMENT   Patient Name: JOI LEYVA MRN: [id number] DOB:1961/01/05, 63 y.o., female Today's Date: 06/12/2024   PT End of Session - 06/12/24 1425     Visit Number 6    Number of Visits 10    Date for PT Re-Evaluation 07/02/24    Authorization Type 11 visit between 4/30- 7/28    Authorization - Number of Visits 11    PT Start Time 1417    PT Stop Time 1500    PT Time Calculation (min) 43 min    Activity Tolerance Patient tolerated treatment well;No increased pain    Behavior During Therapy WFL for tasks assessed/performed          Past Medical History:  Diagnosis Date   Alopecia    Amenorrhea    Anxiety    BRCA negative 2014   2014 BRCA neg; 2017 MyRisk update testing neg   Fibroid    Hormone disorder    IBS (irritable bowel syndrome)    Pyelonephritis    Thyroid  disease    Past Surgical History:  Procedure Laterality Date   CESAREAN SECTION     COLONOSCOPY  2013   OPEN REDUCTION INTERNAL FIXATION (ORIF) CALCANEAL FRACTURE WITH FUSION     Patient Active Problem List   Diagnosis Date Noted   Age-related osteoporosis without current pathological fracture 05/08/2023   Family history of ovarian cancer 02/06/2022   Arthritis 06/13/2018   Anxiety 06/13/2018   Allergic rhinitis 06/13/2018   Hypothyroidism due to Hashimoto's thyroiditis 12/31/2015   Multiple thyroid  nodules 12/31/2015   Acquired hypothyroidism 12/28/2014    PCP: Valora Agent , MD  REFERRING PROVIDER: [place]   REFERRING DIAG: N39.46 (ICD-10-CM) - Mixed incontinence urge and stress   Rationale for Evaluation and Treatment Rehabilitation  THERAPY DIAG:  Leg length discrepancy  Other lack of coordination  Other abnormalities of gait and mobility  ONSET DATE:   SUBJECTIVE:                   SUBJECTIVE STATEMENT TODAY:  Pt reports doing her exercises , does prefer massage bottom of feet more than top of feet                                                                                                         SUBJECTIVE STATEMENT ON EVAL 04/23/24 :  1)  urge and stress incontinence:  started before and after pregnancy 25 years ago, worsened with menopause. Pt had 2 C sections . Pt wears period underwear and it is rare to change midday       Activities causing leakage: sneezing, getting up of floor ,  sit up motion with rowing.    Hx of  sacroiliac pain which started in college when she was washing a large fabric in a deep sink and leaning. Pt goes to chiropractic Tx which helps keep the pain at bay. Two days, L side hurt 3/10 pain level. Non radiating. Eases quickly. Pain with sit to stand.    2) plantar fascitis B:  occurs  days per month,  3/10  occurs after walking in non athletic shoes.     PERTINENT HISTORY:   Fitness 1.5 miles in 20 min several days a week , stopping doing the rowing machine, improve balance / strength training at home   Osteoporosis, arthritis , 2 C sections   PAIN:  Are you having pain? Yes see above   PRECAUTIONS: None   WEIGHT BEARING RESTRICTIONS: No   FALLS:  Has patient fallen in last 6 months? No  LIVING ENVIRONMENT: Lives with: lives with their family Lives in: two story  home  Stairs:  2 STE   OCCUPATION: teacher  PLOF: IND   PATIENT GOALS:  Improve leakage  , being able to get off the floor, cough, sneezing , and use rowing machine with minimal and zero leakage    OBJECTIVE:   [facility] PT Assessment - 06/12/24 1515       Palpation   Palpation comment hypomobile SIJ, tib/fib R, lateral calcaneus/ midfoot joint R,  midfoot joints L          [facility] Adult PT Treatment/Exercise - 06/12/24 1514       Neuro Re-ed    Neuro Re-ed Details  cued for pelvic floor and SIJ stretch for form and propioception      Manual Therapy   Manual therapy comments STM/MWM at R SIJ, PA/AP mob Grade III at midfoot joints,, PA mob tib-fib to promote more ER of ilia, knee/ DF/EV/ toe abdu                HOME EXERCISE PROGRAM: See pt instruction section    ASSESSMENT:  CLINICAL IMPRESSION:    Improvements addressing her deficits urge and stress incontinence,  plantar fascitis B:  Pt no longer has tingling in L foot Plantar fascitiis B 0-1/10 to 1/10  even pelvic girdle and shoulder height with shoe lift in L toe box and heel Improved DF AROM on R  Increased R heel raise reps to 20 reps , equal to L  with UE support   Further addressed B foot hypombility / R SIJ  related longer leg RLE.  Prescribed stretches for pelvic floor and SIJ to promote more ER of ilia, knee/ DF/EV/ toe abduction.  Pt continues to wear shoe lift in LE which will continue to level out pelvic alignment. Anticipate these improvements in [facility] will help yield longer lasting changes for pelvic floor function and improve incontinence.   Plan to progress to deep core trianing next session.   Anticipate regional interdependence approach to treatments will optimize IAP system for improved pelvic floor function, trunk stability, gait, balance, stabilization with mobility tasks.  Plan to address pelvic floor issues once pelvis and spine are realigned to yield better outcomes.   Pt benefits from skilled PT.      OBJECTIVE IMPAIRMENTS decreased activity tolerance, decreased coordination, decreased endurance, decreased mobility, difficulty walking, decreased ROM, decreased strength, decreased safety awareness, hypomobility, increased muscle spasms, impaired flexibility, improper body mechanics, postural dysfunction, and scar restrictions   ACTIVITY LIMITATIONS  self-care,  sleep, home chores, work tasks    PARTICIPATION LIMITATIONS:  community, fitness activities    PERSONAL FACTORS        are also affecting patient's functional outcome.    REHAB POTENTIAL: Good   CLINICAL DECISION MAKING: Evolving/moderate complexity   EVALUATION COMPLEXITY: Moderate    PATIENT EDUCATION:    Education details:  Showed pt anatomy images. Explained muscles attachments/ connection, physiology of deep core  system/ spinal- thoracic-pelvis-lower kinetic chain as they relate to pt's presentation, Sx, and past Hx. Explained what and how these areas of deficits need to be restored to balance and function    See Therapeutic activity / neuromuscular re-education section  Answered pt's questions.   Person educated: Patient Education method: Explanation, Demonstration, Tactile cues, Verbal cues, and Handouts Education comprehension: verbalized understanding, returned demonstration, verbal cues required, tactile cues required, and needs further education     PLAN: PT FREQUENCY: 1x/week   PT DURATION: 10 weeks   PLANNED INTERVENTIONS: Therapeutic exercises, Therapeutic activity, Neuromuscular re-education, Balance training, Gait training, Patient/Family education, Self Care, Joint mobilization, Spinal mobilization, Moist heat, Taping, and Manual therapy, dry needling.   PLAN FOR NEXT SESSION: See clinical impression for plan     GOALS: Goals reviewed with patient? Yes  SHORT TERM GOALS: Target date: 05/21/2024    Pt will demo IND with HEP                    Baseline: Not IND            Goal status: INITIAL   LONG TERM GOALS: Target date: 07/02/2024    1.Pt will demo proper deep core coordination without chest breathing and optimal excursion of diaphragm/pelvic floor in order to promote spinal stability and pelvic floor function  Baseline: dyscoordination Goal status: INITIAL  2.  Pt will demo proper body mechanics in against gravity tasks ( sitting, sit to stand, stand<>floor) and ADLs  work tasks, fitness (strengthening at home gym) to minimize straining pelvic floor / back    Baseline: not IND, improper form that places strain on pelvic floor  Goal status: INITIAL    3. Pt will demo increased gait speed > 1.3 m/s with reciprocal gait pattern, longer stride length  in order to ambulate safely  in community and return to fitness routine  Baseline: 1.22 m/s ( R trunk lean, decerased stance on R, limited posterior rotationof R thorax ),   Goal status: INITIAL    4. Pt will demo levelled pelvic girdle and shoulder height in order to progress to deep core strengthening HEP and restore mobility at spine, pelvis, gait, posture minimize falls, and improve balance   Baseline: R Shoulder and iliac crest higher  Goal status: INITIAL   5. Pt will improve PFDI-7 questionnaire to  pts  score  to demo improved QOL  Baseline:    ( greater pts indicate greater negative impact on QOL)     57 pts  ( total)    48  pts  ( UIQ-7 )    0 pts  ( CRAIQ-7 )    0 pts  ( POPIQ-7 )  Goal Status: INITIAL       Pia Lupe Plump, PT 06/12/2024, 2:25 PM

## 2024-06-18 ENCOUNTER — Ambulatory Visit: Payer: BC Managed Care – PPO | Admitting: Physical Therapy

## 2024-06-18 DIAGNOSIS — M2170 Unequal limb length (acquired), unspecified site: Secondary | ICD-10-CM | POA: Diagnosis not present

## 2024-06-18 DIAGNOSIS — R2689 Other abnormalities of gait and mobility: Secondary | ICD-10-CM

## 2024-06-18 DIAGNOSIS — R278 Other lack of coordination: Secondary | ICD-10-CM

## 2024-06-18 NOTE — Patient Instructions (Addendum)
  Childs pose rocking   -0 wide knees    Toes tucked, shoulders down and back, on forearms , hands shoulder width apart, fingers straight, elbow back , squeeze imaginary pencils in armpit, shoulder down and away from ears   10 reps    __  Stretch for pelvic floor     On belly: Riding horse edge of mattress  knee bent like riding a horse, move knee towards armpit and out  10 reps   Mermaid stretch  Rocking while seated on the floor with heels to one side of the hip Heels to one side of the hip  Rock forward towards the knee that is bent , rock beck towards the opposite sitting bones    ___  SIT TO STAND  alignment  wider knerss for more pelvic girdle support in sit to stand , exhale tp get up

## 2024-06-18 NOTE — Therapy (Addendum)
 OUTPATIENT PHYSICAL THERAPY TREATMENT   Patient Name: Kelly Ortiz MRN: [id number] DOB:[birth year], 63 y.o., female Today's Date: 06/18/2024   PT End of Session -06/18/24     Visit Number 6   Number of Visits 10    Date for PT Re-Evaluation 07/02/24    Authorization Type 11 visit between 4/30- 7/28    Authorization - Number of Visits 11    PT Start Time 1419    PT Stop Time 1507   PT Time Calculation (min) 43 min    Activity Tolerance Patient tolerated treatment well;No increased pain    Behavior During Therapy WFL for tasks assessed/performed          Past Medical History:  Diagnosis Date   Alopecia    Amenorrhea    Anxiety    BRCA negative 2014   2014 BRCA neg; 2017 MyRisk update testing neg   Fibroid    Hormone disorder    IBS (irritable bowel syndrome)    Pyelonephritis    Thyroid  disease    Past Surgical History:  Procedure Laterality Date   CESAREAN SECTION     COLONOSCOPY  2013   OPEN REDUCTION INTERNAL FIXATION (ORIF) CALCANEAL FRACTURE WITH FUSION     Patient Active Problem List   Diagnosis Date Noted   Age-related osteoporosis without current pathological fracture 05/08/2023   Family history of ovarian cancer 02/06/2022   Arthritis 06/13/2018   Anxiety 06/13/2018   Allergic rhinitis 06/13/2018   Hypothyroidism due to Hashimoto's thyroiditis 12/31/2015   Multiple thyroid  nodules 12/31/2015   Acquired hypothyroidism 12/28/2014    PCP: Valora Agent , MD  REFERRING PROVIDER: [place]   REFERRING DIAG: N39.46 (ICD-10-CM) - Mixed incontinence urge and stress   Rationale for Evaluation and Treatment Rehabilitation  THERAPY DIAG:  Leg length discrepancy  Other lack of coordination  Other abnormalities of gait and mobility  ONSET DATE:   SUBJECTIVE:                   SUBJECTIVE STATEMENT TODAY:  Pt reports she did not get to do her HEP this past week due to busyness. Pt noticed inside of back heel of L foot when walking into clinic but it  eased quickly.    SUBJECTIVE STATEMENT ON EVAL 04/23/24 :  1)  urge and stress incontinence:  started before and after pregnancy 25 years ago, worsened with menopause. Pt had 2 C sections . Pt wears period underwear and it is rare to change midday       Activities causing leakage: sneezing, getting up of floor ,  sit up motion with rowing.    Hx of  sacroiliac pain which started in college when she was washing a large fabric in a deep sink and leaning. Pt goes to chiropractic Tx which helps keep the pain at bay. Two days, L side hurt 3/10 pain level. Non radiating. Eases quickly. Pain with sit to stand.    2) plantar fascitis B:  occurs  days per month,  3/10  occurs after walking in non athletic shoes.     PERTINENT HISTORY:   Fitness 1.5 miles in 20 min several days a week , stopping doing the rowing machine, improve balance / strength training at home   Osteoporosis, arthritis , 2 C sections   PAIN:  Are you having pain? Yes see above   PRECAUTIONS: None   WEIGHT BEARING RESTRICTIONS: No   FALLS:  Has patient fallen in last 6 months? No  LIVING ENVIRONMENT: Lives with: lives with their family Lives in: two story  home  Stairs:  2 STE   OCCUPATION: teacher  PLOF: IND   PATIENT GOALS:  Improve leakage  , being able to get off the floor, cough, sneezing , and use rowing machine with minimal and zero leakage    OBJECTIVE:    [facility] PT Assessment - 06/18/24 1459       Strength   Overall Strength Comments hip ext R 3/5, L 5/5, hip abd B 3/5      Palpation   Palpation comment hypomobile at L midfoot, R SIJ hypomobile limited in nutation, iliac limited in ER, tightness along coccygeus R ,          [facility] Adult PT Treatment/Exercise - 06/18/24 1501       Self-Care   Other Self-Care Comments  explained arthokinematics of SIJ and corrections to address R hypombille due to leg length difference, discussed asking chiropractor to withhold Tx at SIJ      Therapeutic  Activites    Other Therapeutic Activities cued for alignment and propioception of wider knees for more pelvic girdle support in sit to stand      Neuro Re-ed    Neuro Re-ed Details  cued for SIJ stretches, modified yoga postures to address R hypomobile SIJ      Modalities   Modalities Moist Heat      Moist Heat Therapy   Number Minutes Moist Heat 5 Minutes    Moist Heat Location --   sacrum, supported butterfly pose to promote ilia ER/abd     Manual Therapy   Manual therapy comments rotational mob, PA mob at R SIJ to promote nutation of sacrum, ER/abd of R ilia, , STM/MWM / distraction PA/AP mob grade III at midfoot to promote DF/EV, toe abduction                HOME EXERCISE PROGRAM: See pt instruction section    ASSESSMENT:  CLINICAL IMPRESSION:    Improvements addressing her deficits urge and stress incontinence,  plantar fascitis B:  Pt no longer has tingling in L foot Plantar fascitiis B 0-1/10 to 1/10  even pelvic girdle and shoulder height with shoe lift in L toe box and heel Improved DF AROM on R  Increased R heel raise reps to 20 reps , equal to L  with UE support   Further addressed L foot hypombility / R SIJ  related longer leg RLE.  Pt demo'd improved R SIJ mobility and increased R hip ext strength with increased nutation of sacrum, ER/abd ilia R. Cued for SIJ stretches, modified yoga postures to address R hypomobile SIJ . Cued for propioception and alignment to promote pelvic stability in sit to stand.   Explained arthokinematics of SIJ and corrections to address R hypombille due to leg length difference, discussed asking chiropractor to withhold Tx at SIJ    Anticipate these improvements in [facility] will help yield longer lasting changes for pelvic floor function and improve incontinence.   Plan to progress to deep core trianing next session.   Anticipate regional interdependence approach to treatments will optimize IAP system for improved pelvic floor  function, trunk stability, gait, balance, stabilization with mobility tasks.  Plan to address pelvic floor issues once pelvis and spine are realigned to yield better outcomes.   Pt benefits from skilled PT.      OBJECTIVE IMPAIRMENTS decreased activity tolerance, decreased coordination, decreased endurance, decreased mobility, difficulty walking, decreased ROM, decreased  strength, decreased safety awareness, hypomobility, increased muscle spasms, impaired flexibility, improper body mechanics, postural dysfunction, and scar restrictions   ACTIVITY LIMITATIONS  self-care,  sleep, home chores, work tasks    PARTICIPATION LIMITATIONS:  community, fitness activities    PERSONAL FACTORS        are also affecting patient's functional outcome.    REHAB POTENTIAL: Good   CLINICAL DECISION MAKING: Evolving/moderate complexity   EVALUATION COMPLEXITY: Moderate    PATIENT EDUCATION:    Education details: Showed pt anatomy images. Explained muscles attachments/ connection, physiology of deep core system/ spinal- thoracic-pelvis-lower kinetic chain as they relate to pt's presentation, Sx, and past Hx. Explained what and how these areas of deficits need to be restored to balance and function    See Therapeutic activity / neuromuscular re-education section  Answered pt's questions.   Person educated: Patient Education method: Explanation, Demonstration, Tactile cues, Verbal cues, and Handouts Education comprehension: verbalized understanding, returned demonstration, verbal cues required, tactile cues required, and needs further education     PLAN: PT FREQUENCY: 1x/week   PT DURATION: 10 weeks   PLANNED INTERVENTIONS: Therapeutic exercises, Therapeutic activity, Neuromuscular re-education, Balance training, Gait training, Patient/Family education, Self Care, Joint mobilization, Spinal mobilization, Moist heat, Taping, and Manual therapy, dry needling.   PLAN FOR NEXT SESSION: See clinical  impression for plan     GOALS: Goals reviewed with patient? Yes  SHORT TERM GOALS: Target date: 05/21/2024    Pt will demo IND with HEP                    Baseline: Not IND            Goal status: INITIAL   LONG TERM GOALS: Target date: 07/02/2024    1.Pt will demo proper deep core coordination without chest breathing and optimal excursion of diaphragm/pelvic floor in order to promote spinal stability and pelvic floor function  Baseline: dyscoordination Goal status: INITIAL  2.  Pt will demo proper body mechanics in against gravity tasks ( sitting, sit to stand, stand<>floor) and ADLs  work tasks, fitness (strengthening at home gym) to minimize straining pelvic floor / back    Baseline: not IND, improper form that places strain on pelvic floor  Goal status: INITIAL    3. Pt will demo increased gait speed > 1.3 m/s with reciprocal gait pattern, longer stride length  in order to ambulate safely in community and return to fitness routine  Baseline: 1.22 m/s ( R trunk lean, decerased stance on R, limited posterior rotationof R thorax ),   Goal status: INITIAL    4. Pt will demo levelled pelvic girdle and shoulder height in order to progress to deep core strengthening HEP and restore mobility at spine, pelvis, gait, posture minimize falls, and improve balance   Baseline: R Shoulder and iliac crest higher  Goal status: INITIAL   5. Pt will improve PFDI-7 questionnaire to  pts  score  to demo improved QOL  Baseline:    ( greater pts indicate greater negative impact on QOL)     57 pts  ( total)    48  pts  ( UIQ-7 )    0 pts  ( CRAIQ-7 )    0 pts  ( POPIQ-7 )  Goal Status: INITIAL       Pia Lupe Plump, PT 06/18/2024, 2:23 PM

## 2024-06-25 ENCOUNTER — Ambulatory Visit: Payer: BC Managed Care – PPO | Attending: Obstetrics and Gynecology | Admitting: Physical Therapy

## 2024-06-25 DIAGNOSIS — M2170 Unequal limb length (acquired), unspecified site: Secondary | ICD-10-CM | POA: Diagnosis present

## 2024-06-25 DIAGNOSIS — M533 Sacrococcygeal disorders, not elsewhere classified: Secondary | ICD-10-CM | POA: Insufficient documentation

## 2024-06-25 DIAGNOSIS — R278 Other lack of coordination: Secondary | ICD-10-CM | POA: Insufficient documentation

## 2024-06-25 DIAGNOSIS — R2689 Other abnormalities of gait and mobility: Secondary | ICD-10-CM | POA: Insufficient documentation

## 2024-06-25 NOTE — Patient Instructions (Signed)
  Proper body mechanics with getting out of a chair to decrease strain  on back &pelvic floor   Avoid holding your breath when Getting out of the chair:  Scoot to front part of chair chair Heels behind knees, feet are hip width apart, nose over toes  Inhale like you are smelling roses Exhale to stand   __  Minisquat:   Scoot buttocks back slight, hinge like you are looking at your reflection on a pond  Knees behind toes,  Inhale to smell flowers  Exhale on the rise like rocket  Do not lock knees, have more weight across ballmounds of feet, toes relaxed and spread them, not grip them   10 reps x 3 x day    __      Transition from standing to floor :  stand to floor transfer :      _ slow     _ mini squat      _ crawl down with one hand on thigh      _downward dog  - >  shoulders down and back-  walk the dog ( knee bents to lengthe hamstrings)      Floor to stand :   downward dog   Feet are wider than hips, crawl hands back, butt is back, knees behind toes -> squat  Hands at waist , elbows back, chest lifts    __  LIFTING:  Minisquat , shoulders down, and shoulder blades back and down, elbows by ribs  Exhale up

## 2024-06-25 NOTE — Therapy (Addendum)
 OUTPATIENT PHYSICAL THERAPY TREATMENT   Patient Name: Kelly Ortiz MRN: [id number] DOB:1961-08-26, 63 y.o., female Today's Date: 06/25/2024   PT End of Session - 06/25/24     Visit Number 7   Number of Visits 10    Date for PT Re-Evaluation 07/02/24    Authorization Type 11 visit between 4/30- 7/28    Authorization - Number of Visits 11    PT Start Time 1420    PT Stop Time 1500    PT Time Calculation (min) 43 min    Activity Tolerance Patient tolerated treatment well;No increased pain    Behavior During Therapy WFL for tasks assessed/performed          Past Medical History:  Diagnosis Date   Alopecia    Amenorrhea    Anxiety    BRCA negative 2014   2014 BRCA neg; 2017 MyRisk update testing neg   Fibroid    Hormone disorder    IBS (irritable bowel syndrome)    Pyelonephritis    Thyroid  disease    Past Surgical History:  Procedure Laterality Date   CESAREAN SECTION     COLONOSCOPY  2013   OPEN REDUCTION INTERNAL FIXATION (ORIF) CALCANEAL FRACTURE WITH FUSION     Patient Active Problem List   Diagnosis Date Noted   Age-related osteoporosis without current pathological fracture 05/08/2023   Family history of ovarian cancer 02/06/2022   Arthritis 06/13/2018   Anxiety 06/13/2018   Allergic rhinitis 06/13/2018   Hypothyroidism due to Hashimoto's thyroiditis 12/31/2015   Multiple thyroid  nodules 12/31/2015   Acquired hypothyroidism 12/28/2014    PCP: Valora Agent , MD  REFERRING PROVIDER: [place]   REFERRING DIAG: N39.46 (ICD-10-CM) - Mixed incontinence urge and stress   Rationale for Evaluation and Treatment Rehabilitation  THERAPY DIAG:  Leg length discrepancy  Other lack of coordination  Other abnormalities of gait and mobility  ONSET DATE:   SUBJECTIVE:                   SUBJECTIVE STATEMENT TODAY:  Pt reports no pain /discomfort after last session. Pt noticed on SUnday ( 4th day after session), there was a large bruise along the top half of  L shin.  Pt does not feel pain with it during walking nor any movements. Pt fels it it tender to the touch at the top half which is an area that is white the size of a thumbprint. Pt has been applying Arnica on the area. The color is less green and more yellow since Sunday ( 3 days from the day she noticed it).    Pt has not been rowing to notice if she has leakage with that movement. Pt has been attentive to not wait to so severely to get to the bathroom when bladder is full.  First thing in the morning , leakage is worst. When she stands up she gets a little bit of leakage. In in the past she noticed leakage when bladder is full when she wakes up in the morning , sneezing, and getting up of the floor, lifting,        SUBJECTIVE STATEMENT ON EVAL 04/23/24 :  1)  urge and stress incontinence:  started before and after pregnancy 25 years ago, worsened with menopause. Pt had 2 C sections . Pt wears period underwear and it is rare to change midday       Activities causing leakage: sneezing, getting up of floor ,  sit up motion with rowing.  Hx of  sacroiliac pain which started in college when she was washing a large fabric in a deep sink and leaning. Pt goes to chiropractic Tx which helps keep the pain at bay. Two days, L side hurt 3/10 pain level. Non radiating. Eases quickly. Pain with sit to stand.    2) plantar fascitis B:  occurs  days per month,  3/10  occurs after walking in non athletic shoes.     PERTINENT HISTORY:   Fitness 1.5 miles in 20 min several days a week , stopping doing the rowing machine, improve balance / strength training at home   Osteoporosis, arthritis , 2 C sections   PAIN:  Are you having pain? Yes see above   PRECAUTIONS: None   WEIGHT BEARING RESTRICTIONS: No   FALLS:  Has patient fallen in last 6 months? No  LIVING ENVIRONMENT: Lives with: lives with their family Lives in: two story  home  Stairs:  2 STE   OCCUPATION: teacher  PLOF: IND    PATIENT GOALS:  Improve leakage  , being able to get off the floor, cough, sneezing , and use rowing machine with minimal and zero leakage    OBJECTIVE:   [facility] PT Assessment - 06/25/24 1459       Squat   Comments addcuted knees, knees more anterior,      Floor to Stand   Comments ankles crossed, half kneeling,      Other:   Other/Comments rounded shoulder with lifting, poor stabilization with posterior thoracolumbar system      Palpation   SI assessment  levelled pelvis and shoulder with L shoe lift in toe box and heel    B SIJ mobility improved    Palpation comment 1.55 m/s, reciprocal gait          [facility] Adult PT Treatment/Exercise - 06/25/24 1816       Self-Care   Other Self-Care Comments  explained pt to continue observing the bruise on her L anterior leg , observe for pain and differentiate Sx of DVT which needs immediate medial attention, explained signs of DVT,      Therapeutic Activites    Other Therapeutic Activities cued for stand<>floor with pelvic stability and minimizing pelvic floor , explained this method to help with urinary issues      Neuro Re-ed    Neuro Re-ed Details  cued for alignment and propioception with standing to minisquat to downward facing dog to quadriped to floor and visa versa to transition to floor, cued for proper squat for bending, cued for scapular/cervical retraction with lifting             HOME EXERCISE PROGRAM: See pt instruction section    ASSESSMENT:  CLINICAL IMPRESSION:    Improvements addressing her deficits urge and stress incontinence,  plantar fascitis B:  Pt no longer has tingling in L foot Plantar fascitiis B 0-1/10 to 1/10  even pelvic girdle and shoulder height with shoe lift in L toe box and heel Improved DF AROM on R  Increased R heel raise reps to 20 reps , equal to L  with UE support   Progressed to body mechanics with bending, lifting, transfers to floor which are activities during which pt  experiences leakage. Cued for alignment and propioception with standing to minisquat to downward facing dog to quadriped to floor and visa versa to transition to floor, cued for proper squat for bending, cued for scapular/cervical retraction with lifting in order to promote pelvic  girdle stability and decreasing load onto pelvic floor . Anticipate these improvements in posterior chain/ LKC will help yield longer lasting changes for pelvic floor function and improve incontinence.   Plan to progress to deep core trianing next session.   Anticipate regional interdependence approach to treatments will optimize IAP system for improved pelvic floor function, trunk stability, gait, balance, stabilization with mobility tasks.  Plan to address pelvic floor issues once pelvis and spine are realigned to yield better outcomes.   Pt benefits from skilled PT.      OBJECTIVE IMPAIRMENTS decreased activity tolerance, decreased coordination, decreased endurance, decreased mobility, difficulty walking, decreased ROM, decreased strength, decreased safety awareness, hypomobility, increased muscle spasms, impaired flexibility, improper body mechanics, postural dysfunction, and scar restrictions   ACTIVITY LIMITATIONS  self-care,  sleep, home chores, work tasks    PARTICIPATION LIMITATIONS:  community, fitness activities    PERSONAL FACTORS        are also affecting patient's functional outcome.    REHAB POTENTIAL: Good   CLINICAL DECISION MAKING: Evolving/moderate complexity   EVALUATION COMPLEXITY: Moderate    PATIENT EDUCATION:    Education details: Showed pt anatomy images. Explained muscles attachments/ connection, physiology of deep core system/ spinal- thoracic-pelvis-lower kinetic chain as they relate to pt's presentation, Sx, and past Hx. Explained what and how these areas of deficits need to be restored to balance and function    See Therapeutic activity / neuromuscular re-education  section  Answered pt's questions.   Person educated: Patient Education method: Explanation, Demonstration, Tactile cues, Verbal cues, and Handouts Education comprehension: verbalized understanding, returned demonstration, verbal cues required, tactile cues required, and needs further education     PLAN: PT FREQUENCY: 1x/week   PT DURATION: 10 weeks   PLANNED INTERVENTIONS: Therapeutic exercises, Therapeutic activity, Neuromuscular re-education, Balance training, Gait training, Patient/Family education, Self Care, Joint mobilization, Spinal mobilization, Moist heat, Taping, and Manual therapy, dry needling.   PLAN FOR NEXT SESSION: See clinical impression for plan     GOALS: Goals reviewed with patient? Yes  SHORT TERM GOALS: Target date: 05/21/2024    Pt will demo IND with HEP                    Baseline: Not IND            Goal status: INITIAL   LONG TERM GOALS: Target date: 07/02/2024    1.Pt will demo proper deep core coordination without chest breathing and optimal excursion of diaphragm/pelvic floor in order to promote spinal stability and pelvic floor function  Baseline: dyscoordination Goal status: INITIAL   2.  Pt will demo proper body mechanics in against gravity tasks ( sitting, sit to stand, stand<>floor) and ADLs  work tasks, fitness (strengthening at home gym) to minimize straining pelvic floor / back    Baseline: not IND, improper form that places strain on pelvic floor  Goal status: INITIAL    3. Pt will demo increased gait speed > 1.3 m/s with reciprocal gait pattern, longer stride length  in order to ambulate safely in community and return to fitness routine  Baseline: 1.22 m/s ( R trunk lean, decerased stance on R, limited posterior rotationof R thorax ),   Goal status: INITIAL    4. Pt will demo levelled pelvic girdle and shoulder height in order to progress to deep core strengthening HEP and restore mobility at spine, pelvis, gait, posture minimize  falls, and improve balance   Baseline: R Shoulder and iliac crest higher  Goal status: MET     5. Pt will improve PFDI-7 questionnaire to  pts  score  to demo improved QOL  Baseline:    ( greater pts indicate greater negative impact on QOL)     57 pts  ( total)    48  pts  ( UIQ-7 )    0 pts  ( CRAIQ-7 )    0 pts  ( POPIQ-7 )  Goal Status: INITIAL       Pia Lupe Plump, PT 06/25/2024, 2:29 PM

## 2024-07-02 ENCOUNTER — Ambulatory Visit: Payer: BC Managed Care – PPO | Admitting: Physical Therapy

## 2024-07-09 ENCOUNTER — Ambulatory Visit: Payer: BC Managed Care – PPO | Admitting: Physical Therapy

## 2024-07-09 DIAGNOSIS — R2689 Other abnormalities of gait and mobility: Secondary | ICD-10-CM

## 2024-07-09 DIAGNOSIS — M2170 Unequal limb length (acquired), unspecified site: Secondary | ICD-10-CM

## 2024-07-09 DIAGNOSIS — R278 Other lack of coordination: Secondary | ICD-10-CM

## 2024-07-09 NOTE — Therapy (Unsigned)
 OUTPATIENT PHYSICAL THERAPY TREATMENT  / Recert   Patient Name: Kelly Ortiz MRN: [id number] DOB:22-Jul-1961, 63 y.o., female Today's Date: 07/09/2024   PT End of Session - 07/09/24 1433     Visit Number 8    Number of Visits 10    Date for PT Re-Evaluation 09/17/24    Authorization Type 11 visit between 4/30- 7/28    Authorization - Visit Number 9    Authorization - Number of Visits 11    PT Start Time 1430    PT Stop Time 1508    PT Time Calculation (min) 38 min    Activity Tolerance Patient tolerated treatment well;No increased pain    Behavior During Therapy WFL for tasks assessed/performed           Past Medical History:  Diagnosis Date   Alopecia    Amenorrhea    Anxiety    BRCA negative 2014   2014 BRCA neg; 2017 MyRisk update testing neg   Fibroid    Hormone disorder    IBS (irritable bowel syndrome)    Pyelonephritis    Thyroid  disease    Past Surgical History:  Procedure Laterality Date   CESAREAN SECTION     COLONOSCOPY  2013   OPEN REDUCTION INTERNAL FIXATION (ORIF) CALCANEAL FRACTURE WITH FUSION     Patient Active Problem List   Diagnosis Date Noted   Age-related osteoporosis without current pathological fracture 05/08/2023   Family history of ovarian cancer 02/06/2022   Arthritis 06/13/2018   Anxiety 06/13/2018   Allergic rhinitis 06/13/2018   Hypothyroidism due to Hashimoto's thyroiditis 12/31/2015   Multiple thyroid  nodules 12/31/2015   Acquired hypothyroidism 12/28/2014    PCP: Valora Agent , MD  REFERRING PROVIDER: [facility]   REFERRING DIAG: N39.46 (ICD-10-CM) - Mixed incontinence urge and stress   Rationale for Evaluation and Treatment Rehabilitation  THERAPY DIAG:  No diagnosis found.  ONSET DATE:   SUBJECTIVE:                   SUBJECTIVE STATEMENT TODAY:  Pt reports  she made it to the bathroom during the night when her bladder was full  Pt reports leakage with sneezing has improved by 80% .    Pt has not been on  rowing machine yet but plans  to next week   Pt was able to hike up steep elevation without feet pain . Pt did not feel sore anywhere in her body.    SUBJECTIVE STATEMENT ON EVAL 04/23/24 :  1)  urge and stress incontinence:  started before and after pregnancy 25 years ago, worsened with menopause. Pt had 2 C sections . Pt wears period underwear and it is rare to change midday       Activities causing leakage: sneezing, getting up of floor ,  sit up motion with rowing.    Hx of  sacroiliac pain which started in college when she was washing a large fabric in a deep sink and leaning. Pt goes to chiropractic Tx which helps keep the pain at bay. Two days, L side hurt 3/10 pain level. Non radiating. Eases quickly. Pain with sit to stand.    2) plantar fascitis B:  occurs  days per month,  3/10  occurs after walking in non athletic shoes.     PERTINENT HISTORY:   Fitness 1.5 miles in 20 min several days a week , stopping doing the rowing machine, improve balance / strength training at home   Osteoporosis,  arthritis , 2 C sections   PAIN:  Are you having pain? Yes see above   PRECAUTIONS: None   WEIGHT BEARING RESTRICTIONS: No   FALLS:  Has patient fallen in last 6 months? No  LIVING ENVIRONMENT: Lives with: lives with their family Lives in: two story  home  Stairs:  2 STE   OCCUPATION: Runner, broadcasting/film/video  PLOF: IND   PATIENT GOALS:  Improve leakage  , being able to get off the floor, cough, sneezing , and use rowing machine with minimal and zero leakage    OBJECTIVE:        HOME EXERCISE PROGRAM: See pt instruction section    ASSESSMENT:  CLINICAL IMPRESSION:    Improvements addressing her deficits urge and stress incontinence,  plantar fascitis B:  Levelled pelvic girdle and shoulder height with shoe lift in L toe box and heel which address her legl ength difference Pt no longer has tingling in L foot Plantar fascitiis B 0-1/10 to 1/10 Improved DF AROM on R  Increased  R heel raise reps to 20 reps , equal to L  with UE support Pt was able to hike up steep elevation without feet pain . Pt did not feel sore anywhere in her body.   No leakage with getting down to the floor and up with the new method  Pt reports  she made it to the bathroom during the night when her bladder was full Pt reports leakage with sneezing has improved by 80% .      Plan to progress to deep core trianing next session.    Progressed to body mechanics with bending, lifting, transfers to floor which are activities during which pt experiences leakage. Cued for alignment and propioception with standing to minisquat to downward facing dog to quadriped to floor and visa versa to transition to floor, cued for proper squat for bending, cued for scapular/cervical retraction with lifting in order to promote pelvic girdle stability and decreasing load onto pelvic floor . Anticipate these improvements in posterior chain/ LKC will help yield longer lasting changes for pelvic floor function and improve incontinence.      Anticipate regional interdependence approach to treatments will optimize IAP system for improved pelvic floor function, trunk stability, gait, balance, stabilization with mobility tasks.  Plan to address pelvic floor issues once pelvis and spine are realigned to yield better outcomes.   Pt benefits from skilled PT.      OBJECTIVE IMPAIRMENTS decreased activity tolerance, decreased coordination, decreased endurance, decreased mobility, difficulty walking, decreased ROM, decreased strength, decreased safety awareness, hypomobility, increased muscle spasms, impaired flexibility, improper body mechanics, postural dysfunction, and scar restrictions   ACTIVITY LIMITATIONS  self-care,  sleep, home chores, work tasks    PARTICIPATION LIMITATIONS:  community, fitness activities    PERSONAL FACTORS        are also affecting patient's functional outcome.    REHAB POTENTIAL: Good    CLINICAL DECISION MAKING: Evolving/moderate complexity   EVALUATION COMPLEXITY: Moderate    PATIENT EDUCATION:    Education details: Showed pt anatomy images. Explained muscles attachments/ connection, physiology of deep core system/ spinal- thoracic-pelvis-lower kinetic chain as they relate to pt's presentation, Sx, and past Hx. Explained what and how these areas of deficits need to be restored to balance and function    See Therapeutic activity / neuromuscular re-education section  Answered pt's questions.   Person educated: Patient Education method: Explanation, Demonstration, Tactile cues, Verbal cues, and Handouts Education comprehension: verbalized understanding, returned demonstration, verbal  cues required, tactile cues required, and needs further education     PLAN: PT FREQUENCY: 1x/week   PT DURATION: 10 weeks   PLANNED INTERVENTIONS: Therapeutic exercises, Therapeutic activity, Neuromuscular re-education, Balance training, Gait training, Patient/Family education, Self Care, Joint mobilization, Spinal mobilization, Moist heat, Taping, and Manual therapy, dry needling.   PLAN FOR NEXT SESSION: See clinical impression for plan     GOALS: Goals reviewed with patient? Yes  SHORT TERM GOALS: Target date: 05/21/2024    Pt will demo IND with HEP                    Baseline: Not IND            Goal status: IMET    LONG TERM GOALS: Target date: 09/17/2024      1.Pt will demo proper deep core coordination without chest breathing and optimal excursion of diaphragm/pelvic floor in order to promote spinal stability and pelvic floor function  Baseline: dyscoordination Goal status: Ongoing    2.  Pt will demo proper body mechanics in against gravity tasks ( sitting, sit to stand, stand<>floor) and ADLs  work tasks, fitness (strengthening at home gym) to minimize straining pelvic floor / back    Baseline: not IND, improper form that places strain on pelvic floor  Goal  status: MET    3. Pt will demo increased gait speed > 1.3 m/s with reciprocal gait pattern, longer stride length  in order to ambulate safely in community and return to fitness routine  Baseline: 1.22 m/s ( R trunk lean, decerased stance on R, limited posterior rotationof R thorax ),   Goal status: MET ( 1.55 m/s , reciprocal )     4. Pt will demo levelled pelvic girdle and shoulder height in order to progress to deep core strengthening HEP and restore mobility at spine, pelvis, gait, posture minimize falls, and improve balance   Baseline: R Shoulder and iliac crest higher  Goal status: MET     5. Pt will improve PFDI-7 questionnaire to  pts  score  to demo improved QOL  Baseline:    ( greater pts indicate greater negative impact on QOL)     57 pts  ( total)    48  pts  ( UIQ-7 )    0 pts  ( CRAIQ-7 )    0 pts  ( POPIQ-7 )  Goal Status: MET  3 pts total      Pia Lupe Plump, PT 07/09/2024, 2:35 PM

## 2024-07-10 NOTE — Addendum Note (Signed)
 Addended by: PIA FISCAL on: 07/10/2024 03:23 PM   Modules accepted: Orders

## 2024-07-16 ENCOUNTER — Ambulatory Visit: Payer: BC Managed Care – PPO | Admitting: Physical Therapy

## 2024-07-16 DIAGNOSIS — M2170 Unequal limb length (acquired), unspecified site: Secondary | ICD-10-CM | POA: Diagnosis not present

## 2024-07-16 DIAGNOSIS — R2689 Other abnormalities of gait and mobility: Secondary | ICD-10-CM

## 2024-07-16 DIAGNOSIS — R278 Other lack of coordination: Secondary | ICD-10-CM

## 2024-07-16 NOTE — Patient Instructions (Addendum)
  SKI against chair   In front of chair, knee against seat to line knee above ankle,  back foot hip width apart, push off through ballmounds,  opp arm up, thumbs up, chin tuck, shoulders down  Push off through ballmounds , step back to under hip width   3 min   ___     Hip abductor and hip flexors stretch at the same time ( to compliment rowing and sitting for long periods )    Alternative if hip is tight,   Sit at 45 deg turn with R leg and knee on edge of chair/ bench, L buttock hanging off the edge to bring the L foot back like a lunge, toes bent, lower heel to feel quad stretch,  pay attention to keeping pinky and first toe ballmound planted to align toes forward so ankles are not twerked   Repeat with other side   ___

## 2024-07-16 NOTE — Therapy (Unsigned)
 OUTPATIENT PHYSICAL THERAPY TREATMENT    Patient Name: Kelly Ortiz MRN: [id number] DOB:[birth year], 63 y.o., female Today's Date: 07/16/2024   PT End of Session - 07/09/24 1433     Visit Number 8    Number of Visits 10    Date for PT Re-Evaluation 09/17/24    Authorization Type 11 visit between 4/30- 7/28    Authorization - Visit Number 9    Authorization - Number of Visits 11    PT Start Time 1430    PT Stop Time 1508    PT Time Calculation (min) 38 min    Activity Tolerance Patient tolerated treatment well;No increased pain    Behavior During Therapy WFL for tasks assessed/performed           Past Medical History:  Diagnosis Date   Alopecia    Amenorrhea    Anxiety    BRCA negative 2014   2014 BRCA neg; 2017 MyRisk update testing neg   Fibroid    Hormone disorder    IBS (irritable bowel syndrome)    Pyelonephritis    Thyroid  disease    Past Surgical History:  Procedure Laterality Date   CESAREAN SECTION     COLONOSCOPY  2013   OPEN REDUCTION INTERNAL FIXATION (ORIF) CALCANEAL FRACTURE WITH FUSION     Patient Active Problem List   Diagnosis Date Noted   Age-related osteoporosis without current pathological fracture 05/08/2023   Family history of ovarian cancer 02/06/2022   Arthritis 06/13/2018   Anxiety 06/13/2018   Allergic rhinitis 06/13/2018   Hypothyroidism due to Hashimoto's thyroiditis 12/31/2015   Multiple thyroid  nodules 12/31/2015   Acquired hypothyroidism 12/28/2014    PCP: Valora Agent , MD  REFERRING PROVIDER: [place]   REFERRING DIAG: N39.46 (ICD-10-CM) - Mixed incontinence urge and stress   Rationale for Evaluation and Treatment Rehabilitation  THERAPY DIAG:  Leg length discrepancy   Other lack of coordination   Other abnormalities of gait and mobility  ONSET DATE:   SUBJECTIVE:                   SUBJECTIVE STATEMENT TODAY:  Pt reports  she did not get a chance to try out rowing.    SUBJECTIVE STATEMENT ON EVAL 04/23/24  :  1)  urge and stress incontinence:  started before and after pregnancy 25 years ago, worsened with menopause. Pt had 2 C sections . Pt wears period underwear and it is rare to change midday       Activities causing leakage: sneezing, getting up of floor ,  sit up motion with rowing.    Hx of  sacroiliac pain which started in college when she was washing a large fabric in a deep sink and leaning. Pt goes to chiropractic Tx which helps keep the pain at bay. Two days, L side hurt 3/10 pain level. Non radiating. Eases quickly. Pain with sit to stand.    2) plantar fascitis B:  occurs  days per month,  3/10  occurs after walking in non athletic shoes.     PERTINENT HISTORY:   Fitness 1.5 miles in 20 min several days a week , stopping doing the rowing machine, improve balance / strength training at home   Osteoporosis, arthritis , 2 C sections   PAIN:  Are you having pain? Yes see above   PRECAUTIONS: None   WEIGHT BEARING RESTRICTIONS: No   FALLS:  Has patient fallen in last 6 months? No  LIVING ENVIRONMENT: Lives with:  lives with their family Lives in: two story  home  Stairs:  2 STE   OCCUPATION: Runner, broadcasting/film/video  PLOF: IND   PATIENT GOALS:  Improve leakage  , being able to get off the floor, cough, sneezing , and use rowing machine with minimal and zero leakage    OBJECTIVE:      [facility] Adult PT Treatment/Exercise - 07/16/24 1433       Therapeutic Activites    Other Therapeutic Activities excessive cues for backward lstepping/lunges, balancing on one foot and propoicoeption for stability and coactivation of scapulothoracolumbar strenghtenig to inimzie forward  head and thoraci kyphosis and promote lenghtening of  hips to compliment rowing      Neuro Re-ed    Neuro Re-ed Details  cued for complimentary stretch to rowing and walking for lengthening hip flexors and hip abductors  , cued for less hyperextended knees on rise with squats, cued for less rounded shoulders in rowing and  minisquat with propioception of elbow position             HOME EXERCISE PROGRAM: See pt instruction section    ASSESSMENT:  CLINICAL IMPRESSION:  Pt has achieved  4/6 goals and progressing well towards remaining goals.   Improvements addressing her deficits urge and stress incontinence,  plantar fascitis B:  Levelled pelvic girdle and shoulder height with shoe lift in L toe box and heel which address her legl ength difference Pt no longer has tingling in L foot Plantar fascitiis B 0-1/10 to 1/10 Improved DF AROM on R  Increased R heel raise reps to 20 reps , equal to L  with UE support Pt was able to hike up steep elevation without feet pain . Pt did not feel sore anywhere in her body.   No leakage with getting down to the floor and up with the new method  Pt reports  she made it to the bathroom during the night when her bladder was full Pt reports leakage with sneezing has improved by 80% .    Anticipate these improvements in posterior chain/ LKC will help yield longer lasting changes for pelvic floor function and improve incontinence.    Cued for scapular/ cervical retraction in simulated rowing position,  and cued for propioception with feet placement and anterior tilt of pelvis in rowing position to minimize relapse of issues. Discussed to have a well rounded  fitnes routine with rowwing every other day instead of daily due to the flexed position of the activitiy. Provided minisquats training and recommnded more walking and to maintain stretches to minimize relapse of poor posture and Sx.    Cued for alignment and propioception of minisquat to miantain improved DF/EV, abd of knee,  anterior tilt of pelvis and strengthening posterior LKC chain   Plan to progress to deep core training next session.  Anticipate regional interdependence approach to treatments will optimize IAP system for improved pelvic floor function, trunk stability, gait, balance, stabilization with  mobility tasks.  Plan to address pelvic floor issues once pelvis and spine are realigned to yield better outcomes.   Pt benefits from skilled PT.      OBJECTIVE IMPAIRMENTS decreased activity tolerance, decreased coordination, decreased endurance, decreased mobility, difficulty walking, decreased ROM, decreased strength, decreased safety awareness, hypomobility, increased muscle spasms, impaired flexibility, improper body mechanics, postural dysfunction, and scar restrictions   ACTIVITY LIMITATIONS  self-care,  sleep, home chores, work tasks    PARTICIPATION LIMITATIONS:  community, fitness activities    PERSONAL FACTORS  are also affecting patient's functional outcome.    REHAB POTENTIAL: Good   CLINICAL DECISION MAKING: Evolving/moderate complexity   EVALUATION COMPLEXITY: Moderate    PATIENT EDUCATION:    Education details: Showed pt anatomy images. Explained muscles attachments/ connection, physiology of deep core system/ spinal- thoracic-pelvis-lower kinetic chain as they relate to pt's presentation, Sx, and past Hx. Explained what and how these areas of deficits need to be restored to balance and function    See Therapeutic activity / neuromuscular re-education section  Answered pt's questions.   Person educated: Patient Education method: Explanation, Demonstration, Tactile cues, Verbal cues, and Handouts Education comprehension: verbalized understanding, returned demonstration, verbal cues required, tactile cues required, and needs further education     PLAN: PT FREQUENCY: 1x/week   PT DURATION: 10 weeks   PLANNED INTERVENTIONS: Therapeutic exercises, Therapeutic activity, Neuromuscular re-education, Balance training, Gait training, Patient/Family education, Self Care, Joint mobilization, Spinal mobilization, Moist heat, Taping, and Manual therapy, dry needling.   PLAN FOR NEXT SESSION: See clinical impression for plan     GOALS: Goals reviewed with  patient? Yes  SHORT TERM GOALS: Target date: 05/21/2024    Pt will demo IND with HEP                    Baseline: Not IND            Goal status: IMET    LONG TERM GOALS: Target date: 09/17/2024      1.Pt will demo proper deep core coordination without chest breathing and optimal excursion of diaphragm/pelvic floor in order to promote spinal stability and pelvic floor function  Baseline: dyscoordination Goal status: ON going   2.  Pt will demo proper body mechanics in against gravity tasks ( sitting, sit to stand, stand<>floor) and ADLs  work tasks, fitness (strengthening at home gym) to minimize straining pelvic floor / back    Baseline: not IND, improper form that places strain on pelvic floor  Goal status: MET    3. Pt will demo increased gait speed > 1.3 m/s with reciprocal gait pattern, longer stride length  in order to ambulate safely in community and return to fitness routine  Baseline: 1.22 m/s ( R trunk lean, decerased stance on R, limited posterior rotationof R thorax ),   Goal status: MET ( 1.55 m/s , reciprocal )     4. Pt will demo levelled pelvic girdle and shoulder height in order to progress to deep core strengthening HEP and restore mobility at spine, pelvis, gait, posture minimize falls, and improve balance   Baseline: R Shoulder and iliac crest higher  Goal status: MET     5. Pt will improve PFDI-7 questionnaire to  pts  score  to demo improved QOL  Baseline:    ( greater pts indicate greater negative impact on QOL)     57 pts  ( total)    48  pts  ( UIQ-7 )    0 pts  ( CRAIQ-7 )    0 pts  ( POPIQ-7 )  Goal Status: MET  3 pts total   6. Pt will report no relapse of Sx across 2 weeks with return to fitness routine of rowing every o there day and daily or every other day of walking and maintaining stretches and deep coore  Baseline:  have Goal Status: INITIAL      Pia Lupe Plump, PT 07/16/2024, 2:23 PM

## 2024-07-23 ENCOUNTER — Ambulatory Visit: Payer: BC Managed Care – PPO | Admitting: Physical Therapy

## 2024-07-23 DIAGNOSIS — R278 Other lack of coordination: Secondary | ICD-10-CM

## 2024-07-23 DIAGNOSIS — M2170 Unequal limb length (acquired), unspecified site: Secondary | ICD-10-CM | POA: Diagnosis not present

## 2024-07-23 DIAGNOSIS — R2689 Other abnormalities of gait and mobility: Secondary | ICD-10-CM

## 2024-07-23 NOTE — Therapy (Signed)
 OUTPATIENT PHYSICAL THERAPY TREATMENT    Patient Name: Kelly Ortiz MRN: [id number] DOB:09/14/1961, 63 y.o., female Today's Date: 07/23/2024   PT End of Session - 07/09/24 1433     Visit Number 8    Number of Visits 10    Date for PT Re-Evaluation 09/17/24    Authorization Type 11 visit between 4/30- 7/28    Authorization - Visit Number 9    Authorization - Number of Visits 11    PT Start Time 1430    PT Stop Time 1508    PT Time Calculation (min) 38 min    Activity Tolerance Patient tolerated treatment well;No increased pain    Behavior During Therapy WFL for tasks assessed/performed           Past Medical History:  Diagnosis Date   Alopecia    Amenorrhea    Anxiety    BRCA negative 2014   2014 BRCA neg; 2017 MyRisk update testing neg   Fibroid    Hormone disorder    IBS (irritable bowel syndrome)    Pyelonephritis    Thyroid  disease    Past Surgical History:  Procedure Laterality Date   CESAREAN SECTION     COLONOSCOPY  2013   OPEN REDUCTION INTERNAL FIXATION (ORIF) CALCANEAL FRACTURE WITH FUSION     Patient Active Problem List   Diagnosis Date Noted   Age-related osteoporosis without current pathological fracture 05/08/2023   Family history of ovarian cancer 02/06/2022   Arthritis 06/13/2018   Anxiety 06/13/2018   Allergic rhinitis 06/13/2018   Hypothyroidism due to Hashimoto's thyroiditis 12/31/2015   Multiple thyroid  nodules 12/31/2015   Acquired hypothyroidism 12/28/2014    PCP: Valora Agent , MD  REFERRING PROVIDER: [facility]   REFERRING DIAG: N39.46 (ICD-10-CM) - Mixed incontinence urge and stress   Rationale for Evaluation and Treatment Rehabilitation  THERAPY DIAG:  Leg length discrepancy   Other lack of coordination   Other abnormalities of gait and mobility  ONSET DATE:   SUBJECTIVE:                   SUBJECTIVE STATEMENT TODAY:  Pt reports  she did not get a chance to try out rowing.    SUBJECTIVE STATEMENT ON EVAL 04/23/24  :  1)  urge and stress incontinence:  started before and after pregnancy 25 years ago, worsened with menopause. Pt had 2 C sections . Pt wears period underwear and it is rare to change midday       Activities causing leakage: sneezing, getting up of floor ,  sit up motion with rowing.    Hx of  sacroiliac pain which started in college when she was washing a large fabric in a deep sink and leaning. Pt goes to chiropractic Tx which helps keep the pain at bay. Two days, L side hurt 3/10 pain level. Non radiating. Eases quickly. Pain with sit to stand.    2) plantar fascitis B:  occurs  days per month,  3/10  occurs after walking in non athletic shoes.     PERTINENT HISTORY:   Fitness 1.5 miles in 20 min several days a week , stopping doing the rowing machine, improve balance / strength training at home   Osteoporosis, arthritis , 2 C sections   PAIN:  Are you having pain? Yes see above   PRECAUTIONS: None   WEIGHT BEARING RESTRICTIONS: No   FALLS:  Has patient fallen in last 6 months? No  LIVING ENVIRONMENT: Lives with:  lives with their family Lives in: two story  home  Stairs:  2 STE   OCCUPATION: teacher  PLOF: IND   PATIENT GOALS:  Improve leakage  , being able to get off the floor, cough, sneezing , and use rowing machine with minimal and zero leakage    OBJECTIVE:    [facility] PT Assessment - 07/23/24 1439       Observation/Other Assessments   Observations ab overuse with deep core training   Squat with more WBing at heels           [facility] Adult PT Treatment/Exercise - 07/23/24 1437       Self-Care   Other Self-Care Comments  explained and showed multidifis mm system to strengthen, explained diassociation of trunk and LKC with multidifis exercise, explained the impotance of propioception of pelvis and feet in squats, deep core , standing multidifis      Neuro Re-ed    Neuro Re-ed Details  excessive cues for less ab overuse with deep core training       Exercises   Exercises Other Exercises    Other Exercises  see pt instructions            HOME EXERCISE PROGRAM: See pt instruction section    ASSESSMENT:  CLINICAL IMPRESSION:  Pt has achieved  4/6 goals and progressing well towards remaining goals.   Improvements addressing her deficits urge and stress incontinence,  plantar fascitis B:  Levelled pelvic girdle and shoulder height with shoe lift in L toe box and heel which address her legl ength difference Pt no longer has tingling in L foot Plantar fascitiis B 0-1/10 to 1/10 Improved DF AROM on R  Increased R heel raise reps to 20 reps , equal to L  with UE support Pt was able to hike up steep elevation without feet pain . Pt did not feel sore anywhere in her body.   No leakage with getting down to the floor and up with the new method  Pt reports  she made it to the bathroom during the night when her bladder was full Pt reports leakage with sneezing has improved by 80% .    Anticipate these improvements in posterior chain/ LKC will help yield longer lasting changes for pelvic floor function and improve incontinence.     Pt showed good carry over with less cues required for alignment and propioception of minisquat to miantain improved DF/EV, abd of knee,  anterior tilt of pelvis.   Today added new HEP :  deep core training which pt required cues for less ab overuse and pelvic pertubation. Also resistance band squats and multidifis with cues for propioception  and diassociation and feet awareness. Pt demo'd correctly post Tx  Plan to reassess backward lunge technique to ensure propioception and alignment.  Anticipate regional interdependence approach to treatments will optimize IAP system for improved pelvic floor function, trunk stability, gait, balance, stabilization with mobility tasks.  Plan to address pelvic floor issues once pelvis and spine are realigned to yield better outcomes.   Pt benefits from skilled PT.       OBJECTIVE IMPAIRMENTS decreased activity tolerance, decreased coordination, decreased endurance, decreased mobility, difficulty walking, decreased ROM, decreased strength, decreased safety awareness, hypomobility, increased muscle spasms, impaired flexibility, improper body mechanics, postural dysfunction, and scar restrictions   ACTIVITY LIMITATIONS  self-care,  sleep, home chores, work tasks    PARTICIPATION LIMITATIONS:  community, fitness activities    PERSONAL FACTORS  are also affecting patient's functional outcome.    REHAB POTENTIAL: Good   CLINICAL DECISION MAKING: Evolving/moderate complexity   EVALUATION COMPLEXITY: Moderate    PATIENT EDUCATION:    Education details: Showed pt anatomy images. Explained muscles attachments/ connection, physiology of deep core system/ spinal- thoracic-pelvis-lower kinetic chain as they relate to pt's presentation, Sx, and past Hx. Explained what and how these areas of deficits need to be restored to balance and function    See Therapeutic activity / neuromuscular re-education section  Answered pt's questions.   Person educated: Patient Education method: Explanation, Demonstration, Tactile cues, Verbal cues, and Handouts Education comprehension: verbalized understanding, returned demonstration, verbal cues required, tactile cues required, and needs further education     PLAN: PT FREQUENCY: 1x/week   PT DURATION: 10 weeks   PLANNED INTERVENTIONS: Therapeutic exercises, Therapeutic activity, Neuromuscular re-education, Balance training, Gait training, Patient/Family education, Self Care, Joint mobilization, Spinal mobilization, Moist heat, Taping, and Manual therapy, dry needling.   PLAN FOR NEXT SESSION: See clinical impression for plan     GOALS: Goals reviewed with patient? Yes  SHORT TERM GOALS: Target date: 05/21/2024    Pt will demo IND with HEP                    Baseline: Not IND            Goal status:  IMET    LONG TERM GOALS: Target date: 09/17/2024      1.Pt will demo proper deep core coordination without chest breathing and optimal excursion of diaphragm/pelvic floor in order to promote spinal stability and pelvic floor function  Baseline: dyscoordination Goal status: ON going   2.  Pt will demo proper body mechanics in against gravity tasks ( sitting, sit to stand, stand<>floor) and ADLs  work tasks, fitness (strengthening at home gym) to minimize straining pelvic floor / back    Baseline: not IND, improper form that places strain on pelvic floor  Goal status: MET    3. Pt will demo increased gait speed > 1.3 m/s with reciprocal gait pattern, longer stride length  in order to ambulate safely in community and return to fitness routine  Baseline: 1.22 m/s ( R trunk lean, decerased stance on R, limited posterior rotationof R thorax ),   Goal status: MET ( 1.55 m/s , reciprocal )     4. Pt will demo levelled pelvic girdle and shoulder height in order to progress to deep core strengthening HEP and restore mobility at spine, pelvis, gait, posture minimize falls, and improve balance   Baseline: R Shoulder and iliac crest higher  Goal status: MET     5. Pt will improve PFDI-7 questionnaire to  pts  score  to demo improved QOL  Baseline:    ( greater pts indicate greater negative impact on QOL)     57 pts  ( total)    48  pts  ( UIQ-7 )    0 pts  ( CRAIQ-7 )    0 pts  ( POPIQ-7 )  Goal Status: MET  3 pts total   6. Pt will report no relapse of Sx across 2 weeks with return to fitness routine of rowing every o there day and daily or every other day of walking and maintaining stretches and deep coore  Baseline:  have Goal Status: INITIAL      Pia Lupe Plump, PT 07/23/2024, 3:14 PM

## 2024-07-23 NOTE — Patient Instructions (Signed)
 Multifidis twist   Band is on doorknob: sit facing perpendicular to door , sit halfway towards front of chair, firm through 4 points of contact at buttocks and feet. Feet are placed hip with apart.   Twisting trunk without moving the hips and knees Hold band at the level of ribcage, elbows bent,shoulder blades roll back and down like squeezing a pencil under armpit   Exhale twist,.10-15 deg away from door without moving your hips/ knees, press more weight on the side of the sitting bones/ foot opp of your direction of turn as your counterweight. Continue to maintain equal weight through legs.  Keep knee unlocked.  30 reps   __  Minisquat with blue band over door , hand position  w to squeeze shoulder blades   Scoot buttocks back slight, hinge like you are looking at your reflection on a pond  Knees behind toes,  Inhale to smell flowers  Exhale on the rise like rocket  Do not lock knees, have more weight across ballmounds of feet, toes relaxed and spread them, not grip them   10 reps x 3 x day    --  deep core level 1-2 ( handout)

## 2024-07-30 ENCOUNTER — Ambulatory Visit: Payer: BC Managed Care – PPO | Attending: Obstetrics and Gynecology | Admitting: Physical Therapy

## 2024-07-30 DIAGNOSIS — M2170 Unequal limb length (acquired), unspecified site: Secondary | ICD-10-CM | POA: Insufficient documentation

## 2024-07-30 DIAGNOSIS — R278 Other lack of coordination: Secondary | ICD-10-CM | POA: Insufficient documentation

## 2024-07-30 DIAGNOSIS — R2689 Other abnormalities of gait and mobility: Secondary | ICD-10-CM | POA: Insufficient documentation

## 2024-07-31 NOTE — Therapy (Signed)
  OUTPATIENT PHYSICAL THERAPY NOTE  Patient Name: Kelly Ortiz MRN: [id number] DOB:Aug 09, 1961, 63 y.o., female Today's Date: 07/31/2024     Past Medical History:  Diagnosis Date   Alopecia    Amenorrhea    Anxiety    BRCA negative 2014   2014 BRCA neg; 2017 MyRisk update testing neg   Fibroid    Hormone disorder    IBS (irritable bowel syndrome)    Pyelonephritis    Thyroid  disease    Past Surgical History:  Procedure Laterality Date   CESAREAN SECTION     COLONOSCOPY  2013   OPEN REDUCTION INTERNAL FIXATION (ORIF) CALCANEAL FRACTURE WITH FUSION     Patient Active Problem List   Diagnosis Date Noted   Age-related osteoporosis without current pathological fracture 05/08/2023   Family history of ovarian cancer 02/06/2022   Arthritis 06/13/2018   Anxiety 06/13/2018   Allergic rhinitis 06/13/2018   Hypothyroidism due to Hashimoto's thyroiditis 12/31/2015   Multiple thyroid  nodules 12/31/2015   Acquired hypothyroidism 12/28/2014    PCP: Valora Agent , MD  REFERRING PROVIDER: [place]   REFERRING DIAG: N39.46 (ICD-10-CM) - Mixed incontinence urge and stress   Rationale for Evaluation and Treatment Rehabilitation  THERAPY DIAG:  Leg length discrepancy   Other lack of coordination   Other abnormalities of gait and mobility  ONSET DATE:   Pt arrived 30 min late and reported she has been very busy, helping dtr, 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 Discussed with pt to hold off today's sessions given late arrive and the limited visits approved by insurance. Pt agreed. No charges billed today. Resume her POC and Tx at next appt next week   Pia Lupe Plump, PT 07/31/2024, 9:27 AM

## 2024-08-06 ENCOUNTER — Ambulatory Visit: Admitting: Physical Therapy

## 2024-08-06 DIAGNOSIS — R278 Other lack of coordination: Secondary | ICD-10-CM

## 2024-08-06 DIAGNOSIS — M2170 Unequal limb length (acquired), unspecified site: Secondary | ICD-10-CM | POA: Diagnosis present

## 2024-08-06 DIAGNOSIS — R2689 Other abnormalities of gait and mobility: Secondary | ICD-10-CM | POA: Diagnosis present

## 2024-08-06 NOTE — Therapy (Signed)
 OUTPATIENT PHYSICAL THERAPY TREATMENT    Patient Name: Kelly Ortiz MRN: [id number] DOB:1960/12/27, 63 y.o., female Today's Date: 08/06/2024   PT End of Session - 07/09/24 1433     Visit Number 8    Number of Visits 10    Date for PT Re-Evaluation 09/17/24    Authorization Type 11 visit between 4/30- 7/28    Authorization - Visit Number 9    Authorization - Number of Visits 11    PT Start Time 1430    PT Stop Time 1508    PT Time Calculation (min) 38 min    Activity Tolerance Patient tolerated treatment well;No increased pain    Behavior During Therapy WFL for tasks assessed/performed           Past Medical History:  Diagnosis Date   Alopecia    Amenorrhea    Anxiety    BRCA negative 2014   2014 BRCA neg; 2017 MyRisk update testing neg   Fibroid    Hormone disorder    IBS (irritable bowel syndrome)    Pyelonephritis    Thyroid  disease    Past Surgical History:  Procedure Laterality Date   CESAREAN SECTION     COLONOSCOPY  2013   OPEN REDUCTION INTERNAL FIXATION (ORIF) CALCANEAL FRACTURE WITH FUSION     Patient Active Problem List   Diagnosis Date Noted   Age-related osteoporosis without current pathological fracture 05/08/2023   Family history of ovarian cancer 02/06/2022   Arthritis 06/13/2018   Anxiety 06/13/2018   Allergic rhinitis 06/13/2018   Hypothyroidism due to Hashimoto's thyroiditis 12/31/2015   Multiple thyroid  nodules 12/31/2015   Acquired hypothyroidism 12/28/2014    PCP: Valora Agent , MD  REFERRING PROVIDER: [place]   REFERRING DIAG: N39.46 (ICD-10-CM) - Mixed incontinence urge and stress   Rationale for Evaluation and Treatment Rehabilitation  THERAPY DIAG:  Leg length discrepancy   Other lack of coordination   Other abnormalities of gait and mobility  ONSET DATE:   SUBJECTIVE:                   SUBJECTIVE STATEMENT TODAY:  Pt reports  she had one episode of leakage last week. Leakage occurs when she has had coffee, and  when bladder is full, when she is stressed emotionally, or lifting two bags of groceries.   SUBJECTIVE STATEMENT ON EVAL 04/23/24 :  1)  urge and stress incontinence:  started before and after pregnancy 25 years ago, worsened with menopause. Pt had 2 C sections . Pt wears period underwear and it is rare to change midday       Activities causing leakage: sneezing, getting up of floor ,  sit up motion with rowing.    Hx of  sacroiliac pain which started in college when she was washing a large fabric in a deep sink and leaning. Pt goes to chiropractic Tx which helps keep the pain at bay. Two days, L side hurt 3/10 pain level. Non radiating. Eases quickly. Pain with sit to stand.    2) plantar fascitis B:  occurs  days per month,  3/10  occurs after walking in non athletic shoes.     PERTINENT HISTORY:   Fitness 1.5 miles in 20 min several days a week , stopping doing the rowing machine, improve balance / strength training at home   Osteoporosis, arthritis , 2 C sections   PAIN:  Are you having pain? Yes see above   PRECAUTIONS: None   WEIGHT  BEARING RESTRICTIONS: No   FALLS:  Has patient fallen in last 6 months? No  LIVING ENVIRONMENT: Lives with: lives with their family Lives in: two story  home  Stairs:  2 STE   OCCUPATION: teacher  PLOF: IND   PATIENT GOALS:  Improve leakage  , being able to get off the floor, cough, sneezing , and use rowing machine with minimal and zero leakage    OBJECTIVE:     [facility] PT Assessment - 08/06/24 1500       Palpation   Palpation comment C section scar restrictions R > L          Pelvic Floor Special Questions - 08/06/24 1456     External Perineal Exam through clothing: tightness L anterior  mm          [facility] Adult PT Treatment/Exercise - 08/06/24 1457       Self-Care   Other Self-Care Comments  expplained anatomy of 3 layers of pelvic floor, L leg shorter asscciated with tighter L side,  Explained role of C setion scacr  restrictions      Neuro Re-ed    Neuro Re-ed Details  cued for pelvic tilt anterior for optimal pelvic floor coordination      Manual Therapy   Manual therapy comments fascial release with lower trunk rotation to minimize C section scar tightness to optiomize pelvic floor coordination                 HOME EXERCISE PROGRAM: See pt instruction section    ASSESSMENT:  CLINICAL IMPRESSION:  Pt has achieved  4/6 goals and progressing well towards remaining goals.   Improvements addressing her deficits urge and stress incontinence,  plantar fascitis B:  Levelled pelvic girdle and shoulder height with shoe lift in L toe box and heel which address her legl ength difference Pt no longer has tingling in L foot Plantar fascitiis B 0-1/10 to 1/10 Improved DF AROM on R  Increased R heel raise reps to 20 reps , equal to L  with UE support Pt was able to hike up steep elevation without feet pain . Pt did not feel sore anywhere in her body.   No leakage with getting down to the floor and up with the new method  Pt reports  she made it to the bathroom during the night when her bladder was full Pt reports leakage with sneezing has improved by 80% .    Anticipate these improvements in posterior chain/ LKC will help yield longer lasting changes for pelvic floor function and improve incontinence.    Provided C section scar fascial mobilization after which pt showe improved deep core coordination and more mobility of pelvic floor. Cued for anterior tilt of pelvic floor for deep core training. Anticipate these improvements will help pt improve continence. Withheld kegel training due to L pelvic floor tightness which is associated with shorter L leg.   Plan to reassess backward lunge technique to ensure propioception and alignment.  Anticipate regional interdependence approach to treatments will optimize IAP system for improved pelvic floor function, trunk stability, gait, balance,  stabilization with mobility tasks.  Plan to address pelvic floor issues once pelvis and spine are realigned to yield better outcomes.   Pt benefits from skilled PT.      OBJECTIVE IMPAIRMENTS decreased activity tolerance, decreased coordination, decreased endurance, decreased mobility, difficulty walking, decreased ROM, decreased strength, decreased safety awareness, hypomobility, increased muscle spasms, impaired flexibility, improper body mechanics, postural dysfunction, and scar  restrictions   ACTIVITY LIMITATIONS  self-care,  sleep, home chores, work tasks    PARTICIPATION LIMITATIONS:  community, fitness activities    PERSONAL FACTORS        are also affecting patient's functional outcome.    REHAB POTENTIAL: Good   CLINICAL DECISION MAKING: Evolving/moderate complexity   EVALUATION COMPLEXITY: Moderate    PATIENT EDUCATION:    Education details: Showed pt anatomy images. Explained muscles attachments/ connection, physiology of deep core system/ spinal- thoracic-pelvis-lower kinetic chain as they relate to pt's presentation, Sx, and past Hx. Explained what and how these areas of deficits need to be restored to balance and function    See Therapeutic activity / neuromuscular re-education section  Answered pt's questions.   Person educated: Patient Education method: Explanation, Demonstration, Tactile cues, Verbal cues, and Handouts Education comprehension: verbalized understanding, returned demonstration, verbal cues required, tactile cues required, and needs further education     PLAN: PT FREQUENCY: 1x/week   PT DURATION: 10 weeks   PLANNED INTERVENTIONS: Therapeutic exercises, Therapeutic activity, Neuromuscular re-education, Balance training, Gait training, Patient/Family education, Self Care, Joint mobilization, Spinal mobilization, Moist heat, Taping, and Manual therapy, dry needling.   PLAN FOR NEXT SESSION: See clinical impression for plan     GOALS: Goals  reviewed with patient? Yes  SHORT TERM GOALS: Target date: 05/21/2024    Pt will demo IND with HEP                    Baseline: Not IND            Goal status: IMET    LONG TERM GOALS: Target date: 09/17/2024      1.Pt will demo proper deep core coordination without chest breathing and optimal excursion of diaphragm/pelvic floor in order to promote spinal stability and pelvic floor function  Baseline: dyscoordination Goal status: ON going   2.  Pt will demo proper body mechanics in against gravity tasks ( sitting, sit to stand, stand<>floor) and ADLs  work tasks, fitness (strengthening at home gym) to minimize straining pelvic floor / back    Baseline: not IND, improper form that places strain on pelvic floor  Goal status: MET    3. Pt will demo increased gait speed > 1.3 m/s with reciprocal gait pattern, longer stride length  in order to ambulate safely in community and return to fitness routine  Baseline: 1.22 m/s ( R trunk lean, decerased stance on R, limited posterior rotationof R thorax ),   Goal status: MET ( 1.55 m/s , reciprocal )     4. Pt will demo levelled pelvic girdle and shoulder height in order to progress to deep core strengthening HEP and restore mobility at spine, pelvis, gait, posture minimize falls, and improve balance   Baseline: R Shoulder and iliac crest higher  Goal status: MET     5. Pt will improve PFDI-7 questionnaire to  pts  score  to demo improved QOL  Baseline:    ( greater pts indicate greater negative impact on QOL)     57 pts  ( total)    48  pts  ( UIQ-7 )    0 pts  ( CRAIQ-7 )    0 pts  ( POPIQ-7 )  Goal Status: MET  3 pts total   6. Pt will report no relapse of Sx across 2 weeks with return to fitness routine of rowing every o there day and daily or every other day of walking and maintaining  stretches and deep coore  Baseline:  have Goal Status: INITIAL      Pia Lupe Plump, PT 08/06/2024, 11:09 AM

## 2024-12-02 ENCOUNTER — Ambulatory Visit
Admission: RE | Admit: 2024-12-02 | Discharge: 2024-12-02 | Disposition: A | Source: Ambulatory Visit | Attending: Obstetrics and Gynecology

## 2024-12-02 DIAGNOSIS — Z1231 Encounter for screening mammogram for malignant neoplasm of breast: Secondary | ICD-10-CM

## 2024-12-11 ENCOUNTER — Ambulatory Visit: Payer: Self-pay | Admitting: Obstetrics and Gynecology

## 2024-12-26 ENCOUNTER — Other Ambulatory Visit: Payer: Self-pay | Admitting: Medical Genetics

## 2025-01-05 ENCOUNTER — Other Ambulatory Visit

## 2025-01-05 ENCOUNTER — Other Ambulatory Visit
Admission: RE | Admit: 2025-01-05 | Discharge: 2025-01-05 | Disposition: A | Discharge status: Home / Self Care | Payer: Self-pay | Source: Ambulatory Visit | Attending: Medical Genetics | Admitting: Medical Genetics

## 2025-01-05 ENCOUNTER — Ambulatory Visit: Attending: Rheumatology | Admitting: Occupational Therapy

## 2025-01-05 DIAGNOSIS — M6281 Muscle weakness (generalized): Secondary | ICD-10-CM | POA: Insufficient documentation

## 2025-01-05 DIAGNOSIS — R278 Other lack of coordination: Secondary | ICD-10-CM | POA: Insufficient documentation

## 2025-01-05 NOTE — Therapy (Signed)
 " OUTPATIENT OCCUPATIONAL THERAPY ORTHO EVALUATION  Patient Name: Kelly Ortiz MRN: [id number] DOB:1961/07/08, 64 y.o., female Today's Date: 01/05/2025  PCP: Valora Lynwood FALCON,  MD REFERRING PROVIDER: Tobie Lady POUR, MD  END OF SESSION:  OT End of Session - 01/05/25 2125     Visit Number 1    Number of Visits 4    Date for Recertification  02/02/25    OT Start Time 1445    OT Stop Time 1530    OT Time Calculation (min) 45 min    Activity Tolerance Patient tolerated treatment well    Behavior During Therapy [facility] for tasks assessed/performed          Past Medical History:  Diagnosis Date   Alopecia    Amenorrhea    Anxiety    BRCA negative 2014   2014 BRCA neg; 2017 MyRisk update testing neg   Fibroid    Hormone disorder    IBS (irritable bowel syndrome)    Pyelonephritis    Thyroid  disease    Past Surgical History:  Procedure Laterality Date   CESAREAN SECTION     COLONOSCOPY  2013   OPEN REDUCTION INTERNAL FIXATION (ORIF) CALCANEAL FRACTURE WITH FUSION     Patient Active Problem List   Diagnosis Date Noted   Age-related osteoporosis without current pathological fracture 05/08/2023   Family history of ovarian cancer 02/06/2022   Arthritis 06/13/2018   Anxiety 06/13/2018   Allergic rhinitis 06/13/2018   Hypothyroidism due to Hashimoto's thyroiditis 12/31/2015   Multiple thyroid  nodules 12/31/2015   Acquired hypothyroidism 12/28/2014    ONSET DATE: 04/2024  REFERRING DIAG: OA of left fingers  THERAPY DIAG:  Muscle weakness (generalized)  Rationale for Evaluation and Treatment: Rehabilitation  SUBJECTIVE:   SUBJECTIVE STATEMENT: Pt. Reports that she would like to stay on top of/ahead of the arthritis in her fingers Pt accompanied by: self  PERTINENT HISTORY:  Pt. Was referred to OT services for for OA in the left fingers with the 2nd digit DIP being the worst. Onset of joint pain started in May 2025. Pt. reports having had a cyst in the left 2nd digit  DIP prior to that about a year and 1/2 ago. Pt. Reports the cyst was drained, and then went away prior to the onset of the joint pain.   PRECAUTIONS: None  RED FLAGS: None   WEIGHT BEARING RESTRICTIONS: No  PAIN:  Are you having pain? 0/10 at the time of the evaluation, 2/10 at its worst at home. Uses Voltaren lotion.  FALLS: Has patient fallen in last 6 months? No  LIVING ENVIRONMENT: Lives with: lives with their spouse Lives in: House/apartment Stairs: 2 steps,  2 levels with a basement Has following equipment at home: Hand held shower head  PLOF: Independent  PATIENT GOALS: To be able to tie shoes, open containers, and cary a bag with the left hand  OBJECTIVE:  Note: Objective measures were completed at Evaluation unless otherwise noted.  HAND DOMINANCE: Right  ADLs: ADLs:  MAM-20 for musculoskeletal conditions: Date:  Eval, Sum Score: 73/80 1= cannot do, 2=Very hard to do, 3=A little hard to do, 4=Easy to do   Rating Choose only one number (definitions above)   4 Wring a towel   3 2. Open a medicine bottle with a child proof cap/top   3 3. Cut nails with a nail clipper    3 4. Open a wide-mouth bottle previously opened   4 5. Cut meat on a plate  3 6. Tie shoes with laces   4 7. Button clothes (medium sized buttons)   4 8. Pick up a 1/2 full water pitcher   4 9. Zip jacket   4 10. Write 3-4 lines legibly (R hand dominant)   4 11. Turn key (to open a door)   3 12. Take things/cards out of a wallet    4 13. Squeeze toothpaste onto a toothbrush   3 14. Handle/count money (bills and coins)   4 15. Brush or comb hair   4 16. Wash hands   4 17. Use a spoon or fork   4 18. Brush teeth (R hand dominant)   3 19. Dial or key in telephone numbers   4 20. Use hand(s) to eat a sandwich    73/80  Total Sum Score     UPPER EXTREMITY ROM:     Active ROM Right eval Left eval  Shoulder flexion    Shoulder abduction    Shoulder adduction    Shoulder extension     Shoulder internal rotation    Shoulder external rotation    Elbow flexion    Elbow extension    Wrist flexion 74 76  Wrist extension 72 70  Wrist ulnar deviation 38 42  Wrist radial deviation 20 20  Wrist pronation    Wrist supination    (Blank rows = not tested)  Active ROM Right Eval  Left Eval   Thumb MCP (0-60)    Thumb IP (0-80) 0-60   Thumb Radial abd/add (0-55) 0-60  0-48  Thumb Palmar abd/add (0-45)     Thumb Opposition to Small Finger [facility]  WFL  Index MCP (0-90) 0-80 0- 80  Index PIP (0-100) 0-110  0-100  Index DIP (0-70)  0-35 0-40  Long MCP (0-90)  0-85  0-85  Long PIP (0-100)  0-110 0-100  Long DIP (0-70)  0-50  0-50  Ring MCP (0-90)  0-85 0-80  Ring PIP (0-100)  0-110 0-90  Ring DIP (0-70)  0-35 0-50  Little MCP (0-90)  0-75  0-80  Little PIP (0-100)  0-110  0-90  Little DIP (0-70)  0-50  0-55  (Blank rows = not tested)   UPPER EXTREMITY MMT:     MMT Right E[facility] Left Eval [facility]  Shoulder flexion    Shoulder abduction    Shoulder adduction    Shoulder extension    Shoulder internal rotation    Shoulder external rotation    Middle trapezius    Lower trapezius    Elbow flexion    Elbow extension    Wrist flexion    Wrist extension    Wrist ulnar deviation    Wrist radial deviation    Wrist pronation    Wrist supination    (Blank rows = not tested)  HAND FUNCTION: Eval: Grip strength: Right: 50 lbs; Left: 46 lbs, Lateral pinch: Right: 13 lbs, Left: 13 lbs, and 3 point pinch: Right: 14 lbs, Left: 12 lbs  COORDINATION: Eval: 9 Hole Peg test: Right: 17 sec; Left: 19 sec  SENSATION: Pt. Reports tingling in the medial distal tip of the left 2nd digit near the nail.   EDEMA: left 2nd DIP  COGNITION: Overall cognitive status: Within functional limits for tasks assessed  TREATMENT DATE: 01/05/25  OT initial evaluation was completed, and Pt. education was provided as indicated below.  PATIENT EDUCATION: Education details: OT services, POC, goals and ADL/IADL functional Status, Condition management, lateral, and 3pt. Pinch exercises. Person educated: Patient Education method: Explanation, Demonstration, and Verbal cues Education comprehension: verbalized understanding, returned demonstration, verbal cues required, and needs further education  HOME EXERCISE PROGRAM:   Continue to assess ongoing need for HEPs, and provide/upgrade as indicated.   -Lateral grip, and 3pt. Pinch strengthening with blue, and green resistive foam.  GOALS: Goals reviewed with patient? Yes  SHORT TERM GOALS: Target date: 02/16/2025    Pt. Will be independent with HEPs for the bilateral UEs.  Baseline: Eval: No current HEP Goal status: INITIAL   LONG TERM GOALS: Target date: 03/30/2025  Pt. Will identify and implement 3 joint protection principals for ADL/IADLs Baseline: Eval: education to be provided  Goal status: INITIAL  2.   Pt . Will improve MAM-20 score by 2 points for improved ADL/IADL performance. Baseline: Eval: 73/80 Goal status: INITIAL  3.  Pt. Will identify 3 adaptive techniques for ADL/IADL functioning.  Baseline: Eval: education to be provided  Goal status: INITIAL  ASSESSMENT:  CLINICAL IMPRESSION:  Patient is a 64 y.o. female who was seen today for occupational therapy evaluation for OA in the left fingers. Pt. Presents with 0-2/10 pain in the left 2nd digit, has difficulty tying her shoes, opening containers, and carrying a bag. MAM-20 score is 73/80 indicating a score of 3(a little hard to do) for opening a medication bottle with a childproof cap, cutting nails with a nail clipper, opening a wide mouth jar previously opened, tie shoes with laces, take things out of a wallet handle/count money, and dial and key in telephone numbers. Pt. Will benefit from OT services to provide education about, and  review joint protection principals, adaptive strategies, and HEPs in order increase engagement of her bilateral hands during daily ADLs, and IADL functioning.   PERFORMANCE DEFICITS: in functional skills including ADLs, IADLs, ROM, strength, pain, and UE functional use, and psychosocial skills including environmental adaptation, interpersonal interactions, and routines and behaviors.   IMPAIRMENTS: are limiting patient from ADLs, rest and sleep, work, and leisure.   COMORBIDITIES: may have co-morbidities  that affects occupational performance. Patient will benefit from skilled OT to address above impairments and improve overall function.  MODIFICATION OR ASSISTANCE TO COMPLETE EVALUATION: Min-Moderate modification of tasks or assist with assess necessary to complete an evaluation.  OT OCCUPATIONAL PROFILE AND HISTORY: Detailed assessment: Review of records and additional review of physical, cognitive, psychosocial history related to current functional performance.  CLINICAL DECISION MAKING: Moderate - several treatment options, min-mod task modification necessary  REHAB POTENTIAL: Good  EVALUATION COMPLEXITY: Moderate      PLAN:  OT FREQUENCY: 1x/week  OT DURATION: 4 weeks  PLANNED INTERVENTIONS: 97168 OT Re-evaluation, 97535 self care/ADL training, 02889 therapeutic exercise, 97530 therapeutic activity, 97140 manual therapy, 97010 moist heat, 97034 contrast bath, 97760 Orthotic Initial, and 97763 Orthotic/Prosthetic subsequent, Paraffin Bath  RECOMMENDED OTHER SERVICES: N/A  CONSULTED AND AGREED WITH PLAN OF CARE: Patient  PLAN FOR NEXT SESSION: Treatment   Richardson Otter, MS, OTR/L  01/05/2025, 9:27 PM   "

## 2025-01-12 ENCOUNTER — Ambulatory Visit: Admitting: Occupational Therapy

## 2025-01-13 LAB — GENECONNECT MOLECULAR SCREEN: Genetic Analysis Overall Interpretation: NEGATIVE

## 2025-01-14 ENCOUNTER — Ambulatory Visit: Admitting: Occupational Therapy

## 2025-01-14 DIAGNOSIS — M6281 Muscle weakness (generalized): Secondary | ICD-10-CM

## 2025-01-14 DIAGNOSIS — R278 Other lack of coordination: Secondary | ICD-10-CM

## 2025-01-14 NOTE — Therapy (Signed)
 " OUTPATIENT OCCUPATIONAL THERAPY ORTHO TREATMENT NOTE  Patient Name: Kelly Ortiz MRN: [id number] DOB:18-Oct-1961, 64 y.o., female Today's Date: 01/14/2025  PCP: Valora Lynwood FALCON,  MD REFERRING PROVIDER: Tobie Lady POUR, MD  END OF SESSION:  OT End of Session - 01/14/25 1728     Visit Number 2    Number of Visits 4    Date for Recertification  02/02/25    OT Start Time 1445    OT Stop Time 1530    OT Time Calculation (min) 45 min    Activity Tolerance Patient tolerated treatment well    Behavior During Therapy [facility] for tasks assessed/performed          Past Medical History:  Diagnosis Date   Alopecia    Amenorrhea    Anxiety    BRCA negative 2014   2014 BRCA neg; 2017 MyRisk update testing neg   Fibroid    Hormone disorder    IBS (irritable bowel syndrome)    Pyelonephritis    Thyroid  disease    Past Surgical History:  Procedure Laterality Date   CESAREAN SECTION     COLONOSCOPY  2013   OPEN REDUCTION INTERNAL FIXATION (ORIF) CALCANEAL FRACTURE WITH FUSION     Patient Active Problem List   Diagnosis Date Noted   Age-related osteoporosis without current pathological fracture 05/08/2023   Family history of ovarian cancer 02/06/2022   Arthritis 06/13/2018   Anxiety 06/13/2018   Allergic rhinitis 06/13/2018   Hypothyroidism due to Hashimoto's thyroiditis 12/31/2015   Multiple thyroid  nodules 12/31/2015   Acquired hypothyroidism 12/28/2014    ONSET DATE: 04/2024  REFERRING DIAG: OA of left fingers  THERAPY DIAG:  Muscle weakness (generalized)  Other lack of coordination  Rationale for Evaluation and Treatment: Rehabilitation  SUBJECTIVE:   SUBJECTIVE STATEMENT: Pt. Reports having had a tough day. Pt accompanied by: self  PERTINENT HISTORY:  Pt. Was referred to OT services for for OA in the left fingers with the 2nd digit DIP being the worst. Onset of joint pain started in May 2025. Pt. reports having had a cyst in the left 2nd digit DIP prior to that  about a year and 1/2 ago. Pt. Reports the cyst was drained, and then went away prior to the onset of the joint pain.   PRECAUTIONS: None  RED FLAGS: None   WEIGHT BEARING RESTRICTIONS: No  PAIN:  Are you having pain?   01/14/2025: 2/10 pain in multiple sites today: left 2nd digit DIP, left hand, knee and foot pain. Eval: 0/10 at the time of the evaluation, 2/10 at its worst at home. Uses Voltaren lotion.  FALLS: Has patient fallen in last 6 months? No  LIVING ENVIRONMENT: Lives with: lives with their spouse Lives in: House/apartment Stairs: 2 steps,  2 levels with a basement Has following equipment at home: Hand held shower head  PLOF: Independent  PATIENT GOALS: To be able to tie shoes, open containers, and cary a bag with the left hand  OBJECTIVE:  Note: Objective measures were completed at Evaluation unless otherwise noted.  HAND DOMINANCE: Right  ADLs: ADLs:  MAM-20 for musculoskeletal conditions: Date:  Eval, Sum Score: 73/80 1= cannot do, 2=Very hard to do, 3=A little hard to do, 4=Easy to do   Rating Choose only one number (definitions above)   4 Wring a towel   3 2. Open a medicine bottle with a child proof cap/top   3 3. Cut nails with a nail clipper    3  4. Open a wide-mouth bottle previously opened   4 5. Cut meat on a plate   3 6. Tie shoes with laces   4 7. Button clothes (medium sized buttons)   4 8. Pick up a 1/2 full water pitcher   4 9. Zip jacket   4 10. Write 3-4 lines legibly (R hand dominant)   4 11. Turn key (to open a door)   3 12. Take things/cards out of a wallet    4 13. Squeeze toothpaste onto a toothbrush   3 14. Handle/count money (bills and coins)   4 15. Brush or comb hair   4 16. Wash hands   4 17. Use a spoon or fork   4 18. Brush teeth (R hand dominant)   3 19. Dial or key in telephone numbers   4 20. Use hand(s) to eat a sandwich    73/80  Total Sum Score     UPPER EXTREMITY ROM:     Active ROM Right eval Left eval   Shoulder flexion    Shoulder abduction    Shoulder adduction    Shoulder extension    Shoulder internal rotation    Shoulder external rotation    Elbow flexion    Elbow extension    Wrist flexion 74 76  Wrist extension 72 70  Wrist ulnar deviation 38 42  Wrist radial deviation 20 20  Wrist pronation    Wrist supination    (Blank rows = not tested)  Active ROM Right Eval  Left Eval   Thumb MCP (0-60)    Thumb IP (0-80) 0-60   Thumb Radial abd/add (0-55) 0-60  0-48  Thumb Palmar abd/add (0-45)     Thumb Opposition to Small Finger [facility]  WFL  Index MCP (0-90) 0-80 0- 80  Index PIP (0-100) 0-110  0-100  Index DIP (0-70)  0-35 0-40  Long MCP (0-90)  0-85  0-85  Long PIP (0-100)  0-110 0-100  Long DIP (0-70)  0-50  0-50  Ring MCP (0-90)  0-85 0-80  Ring PIP (0-100)  0-110 0-90  Ring DIP (0-70)  0-35 0-50  Little MCP (0-90)  0-75  0-80  Little PIP (0-100)  0-110  0-90  Little DIP (0-70)  0-50  0-55  (Blank rows = not tested)   UPPER EXTREMITY MMT:     MMT Right Elyn [facility] Left Eval [facility]  Shoulder flexion    Shoulder abduction    Shoulder adduction    Shoulder extension    Shoulder internal rotation    Shoulder external rotation    Middle trapezius    Lower trapezius    Elbow flexion    Elbow extension    Wrist flexion    Wrist extension    Wrist ulnar deviation    Wrist radial deviation    Wrist pronation    Wrist supination    (Blank rows = not tested)  HAND FUNCTION: Eval: Grip strength: Right: 50 lbs; Left: 46 lbs, Lateral pinch: Right: 13 lbs, Left: 13 lbs, and 3 point pinch: Right: 14 lbs, Left: 12 lbs  COORDINATION: Eval: 9 Hole Peg test: Right: 17 sec; Left: 19 sec  SENSATION: Pt. Reports tingling in the medial distal tip of the left 2nd digit near the nail.   EDEMA: left 2nd DIP  COGNITION: Overall cognitive status: Within functional limits for tasks assessed  TREATMENT DATE: 01/14/25  Paraffin Bath:   Paraffin bath to the bilateral  hands with  a towel wrap for 10 min. 2/2 pain, and stiffness. Paraffin Bath was performed in preparation for manual therapy, and ROM  Manual Therapy:   -Pt. Tolerated STM to the volar, dorsal, medial, and lateral aspects of the bilateral hands, and 2nd through 5th digit MP, PIP, and DIPs 2/2 pain and stiffness. -Manual therapy was performed independent of, and in preparation for therapeutic Act.    Therapeutic Activities:   -Yellow level Theraputty hand strengthening exercises including: gross gripping, gross digit extension, lateral, and 3pt. pinch strengthening, thumb opposition, bilateral alternating twisting motion in opposite directions with supination/pronation, rolling circular spheres within the tips of the fingers, and manipulating putty within the tips of fingers to remove small objects. Pt. was provided with a visual handout HEP through [employer] with a video access code.                                                                                                                         PATIENT EDUCATION: Education details: OT services, POC, goals and ADL/IADL functional Status, Condition management, lateral, and 3pt. Pinch exercises. Person educated: Patient Education method: Explanation, Demonstration, and Verbal cues Education comprehension: verbalized understanding, returned demonstration, verbal cues required, and needs further education  HOME EXERCISE PROGRAM:   Continue to assess ongoing need for HEPs, and provide/upgrade as indicated.   -Lateral grip, and 3pt. Pinch strengthening with blue, and green resistive foam.  GOALS: Goals reviewed with patient? Yes  SHORT TERM GOALS: Target date: 02/16/2025    Pt. Will be independent with HEPs for the bilateral UEs.  Baseline: Eval: No current HEP Goal status: INITIAL   LONG TERM GOALS: Target date: 03/30/2025  Pt. Will identify and implement 3 joint protection principals for ADL/IADLs Baseline: Eval: education to be  provided  Goal status: INITIAL  2.   Pt . Will improve MAM-20 score by 2 points for improved ADL/IADL performance. Baseline: Eval: 73/80 Goal status: INITIAL  3.  Pt. Will identify 3 adaptive techniques for ADL/IADL functioning.  Baseline: Eval: education to be provided  Goal status: INITIAL  ASSESSMENT:  CLINICAL IMPRESSION:  Pt. reports having 2/10 pain today in multiple sites including the left 2nd digit DIP, right hand, 2nd digit DIP, 4th digit DIP, and the knees. Pt. tolerated paraffin bath, and STM well today. Pt. Was able to demonstrate the hand exercises with the yellow theraputty with cues for visual demonstration of form and technique. Pt. continues to benefit from OT services to provide education about, and review joint protection principals, adaptive strategies, and HEPs in order increase engagement of her bilateral hands during daily ADLs, and IADL functioning.   PERFORMANCE DEFICITS: in functional skills including ADLs, IADLs, ROM, strength, pain, and UE functional use, and psychosocial skills including environmental adaptation, interpersonal interactions, and routines and behaviors.   IMPAIRMENTS: are limiting patient from ADLs, rest and sleep, work, and leisure.   COMORBIDITIES: may have co-morbidities  that affects occupational performance. Patient will benefit from skilled OT to address  above impairments and improve overall function.  MODIFICATION OR ASSISTANCE TO COMPLETE EVALUATION: Min-Moderate modification of tasks or assist with assess necessary to complete an evaluation.  OT OCCUPATIONAL PROFILE AND HISTORY: Detailed assessment: Review of records and additional review of physical, cognitive, psychosocial history related to current functional performance.  CLINICAL DECISION MAKING: Moderate - several treatment options, min-mod task modification necessary  REHAB POTENTIAL: Good  EVALUATION COMPLEXITY: Moderate      PLAN:  OT FREQUENCY: 1x/week  OT  DURATION: 4 weeks  PLANNED INTERVENTIONS: 97168 OT Re-evaluation, 97535 self care/ADL training, 02889 therapeutic exercise, 97530 therapeutic activity, 97140 manual therapy, 97010 moist heat, 97034 contrast bath, 97760 Orthotic Initial, and 97763 Orthotic/Prosthetic subsequent, Paraffin Bath  RECOMMENDED OTHER SERVICES: N/A  CONSULTED AND AGREED WITH PLAN OF CARE: Patient  PLAN FOR NEXT SESSION: Treatment  Richardson Otter, MS, OTR/L  01/14/2025, 5:33 PM   "

## 2025-01-19 ENCOUNTER — Ambulatory Visit: Admitting: Occupational Therapy

## 2025-01-21 ENCOUNTER — Ambulatory Visit: Admitting: Occupational Therapy

## 2025-01-21 DIAGNOSIS — M6281 Muscle weakness (generalized): Secondary | ICD-10-CM

## 2025-01-21 NOTE — Therapy (Signed)
 " OUTPATIENT OCCUPATIONAL THERAPY ORTHO TREATMENT NOTE  Patient Name: ELYSSE Ortiz MRN: [id number] DOB:[birth year], 64 y.o., female Today's Date: 01/21/2025  PCP: Kelly Lynwood FALCON,  MD REFERRING PROVIDER: Tobie Lady POUR, MD  END OF SESSION:  OT End of Session - 01/21/25 1719     Visit Number 3    Number of Visits 4    Date for Recertification  02/02/25    OT Start Time 1617    OT Stop Time 1705    OT Time Calculation (min) 48 min    Activity Tolerance Patient tolerated treatment well    Behavior During Therapy [facility] for tasks assessed/performed          Past Medical History:  Diagnosis Date   Alopecia    Amenorrhea    Anxiety    BRCA negative 2014   2014 BRCA neg; 2017 MyRisk update testing neg   Fibroid    Hormone disorder    IBS (irritable bowel syndrome)    Pyelonephritis    Thyroid  disease    Past Surgical History:  Procedure Laterality Date   CESAREAN SECTION     COLONOSCOPY  2013   OPEN REDUCTION INTERNAL FIXATION (ORIF) CALCANEAL FRACTURE WITH FUSION     Patient Active Problem List   Diagnosis Date Noted   Age-related osteoporosis without current pathological fracture 05/08/2023   Family history of ovarian cancer 02/06/2022   Arthritis 06/13/2018   Anxiety 06/13/2018   Allergic rhinitis 06/13/2018   Hypothyroidism due to Hashimoto's thyroiditis 12/31/2015   Multiple thyroid  nodules 12/31/2015   Acquired hypothyroidism 12/28/2014    ONSET DATE: 04/2024  REFERRING DIAG: OA of left fingers  THERAPY DIAG:  Muscle weakness (generalized)  Rationale for Evaluation and Treatment: Rehabilitation  SUBJECTIVE:   SUBJECTIVE STATEMENT: Pt. Reports  doing well today Pt accompanied by: self  PERTINENT HISTORY:  Pt. Was referred to OT services for for OA in the left fingers with the 2nd digit DIP being the worst. Onset of joint pain started in May 2025. Pt. reports having had a cyst in the left 2nd digit DIP prior to that about a year and 1/2 ago. Pt.  Reports the cyst was drained, and then went away prior to the onset of the joint pain.   PRECAUTIONS: None  RED FLAGS: None   WEIGHT BEARING RESTRICTIONS: No  PAIN:  Are you having pain?   12/25/26/26: 0-2/10 bilateral pain hands  01/14/2025: 2/10 pain in multiple sites today: left 2nd digit DIP, left hand, knee and foot pain. Eval: 0/10 at the time of the evaluation, 2/10 at its worst at home. Uses Voltaren lotion.  FALLS: Has patient fallen in last 6 months? No  LIVING ENVIRONMENT: Lives with: lives with their spouse Lives in: House/apartment Stairs: 2 steps,  2 levels with a basement Has following equipment at home: Hand held shower head  PLOF: Independent  PATIENT GOALS: To be able to tie shoes, open containers, and cary a bag with the left hand  OBJECTIVE:  Note: Objective measures were completed at Evaluation unless otherwise noted.  HAND DOMINANCE: Right  ADLs: ADLs:  MAM-20 for musculoskeletal conditions: Date:  Eval, Sum Score: 73/80 1= cannot do, 2=Very hard to do, 3=A little hard to do, 4=Easy to do   Rating Choose only one number (definitions above)   4 Wring a towel   3 2. Open a medicine bottle with a child proof cap/top   3 3. Cut nails with a nail clipper    3  4. Open a wide-mouth bottle previously opened   4 5. Cut meat on a plate   3 6. Tie shoes with laces   4 7. Button clothes (medium sized buttons)   4 8. Pick up a 1/2 full water pitcher   4 9. Zip jacket   4 10. Write 3-4 lines legibly (R hand dominant)   4 11. Turn key (to open a door)   3 12. Take things/cards out of a wallet    4 13. Squeeze toothpaste onto a toothbrush   3 14. Handle/count money (bills and coins)   4 15. Brush or comb hair   4 16. Wash hands   4 17. Use a spoon or fork   4 18. Brush teeth (R hand dominant)   3 19. Dial or key in telephone numbers   4 20. Use hand(s) to eat a sandwich    73/80  Total Sum Score     UPPER EXTREMITY ROM:     Active ROM Right eval  Left eval  Shoulder flexion    Shoulder abduction    Shoulder adduction    Shoulder extension    Shoulder internal rotation    Shoulder external rotation    Elbow flexion    Elbow extension    Wrist flexion 74 76  Wrist extension 72 70  Wrist ulnar deviation 38 42  Wrist radial deviation 20 20  Wrist pronation    Wrist supination    (Blank rows = not tested)  Active ROM Right Eval  Left Eval   Thumb MCP (0-60)    Thumb IP (0-80) 0-60   Thumb Radial abd/add (0-55) 0-60  0-48  Thumb Palmar abd/add (0-45)     Thumb Opposition to Small Finger [facility]  WFL  Index MCP (0-90) 0-80 0- 80  Index PIP (0-100) 0-110  0-100  Index DIP (0-70)  0-35 0-40  Long MCP (0-90)  0-85  0-85  Long PIP (0-100)  0-110 0-100  Long DIP (0-70)  0-50  0-50  Ring MCP (0-90)  0-85 0-80  Ring PIP (0-100)  0-110 0-90  Ring DIP (0-70)  0-35 0-50  Little MCP (0-90)  0-75  0-80  Little PIP (0-100)  0-110  0-90  Little DIP (0-70)  0-50  0-55  (Blank rows = not tested)   UPPER EXTREMITY MMT:     MMT Right Kelly Ortiz Left Eval Kelly Ortiz  Shoulder flexion    Shoulder abduction    Shoulder adduction    Shoulder extension    Shoulder internal rotation    Shoulder external rotation    Middle trapezius    Lower trapezius    Elbow flexion    Elbow extension    Wrist flexion    Wrist extension    Wrist ulnar deviation    Wrist radial deviation    Wrist pronation    Wrist supination    (Blank rows = not tested)  HAND FUNCTION: Eval: Grip strength: Right: 50 lbs; Left: 46 lbs, Lateral pinch: Right: 13 lbs, Left: 13 lbs, and 3 point pinch: Right: 14 lbs, Left: 12 lbs  COORDINATION: Eval: 9 Hole Peg test: Right: 17 sec; Left: 19 sec  SENSATION: Pt. Reports tingling in the medial distal tip of the left 2nd digit near the nail.   EDEMA: left 2nd DIP  COGNITION: Overall cognitive status: Within functional limits for tasks assessed  TREATMENT DATE: 01/21/25  Paraffin Bath:   Paraffin bath to the  bilateral hands with  a towel wrap for 10 min. 2/2 pain, and stiffness. Paraffin Bath was performed in preparation for manual therapy, and ROM  Manual Therapy:   -Pt. Tolerated STM to the volar, dorsal, medial, and lateral aspects of the bilateral hands, and 2nd through 5th digit MP, PIP, and DIPs 2/2 pain and stiffness. -Manual therapy was performed independent of, and in preparation for therapeutic Act.    Therapeutic Exercise:   -PROM followed by AROM with blocking for bilateral hand digit PIP, and DIP flexion/extension  Therapeutic Activities:  Pt. education was provided about joint protection principals for ADL/IADL tasks. Reviewed ergonomic handled adaptive kitchen items for joint protection.                                                                                                                  PATIENT EDUCATION: Education details: OT services, POC, goals and ADL/IADL functional Status, Condition management, lateral, and 3pt. Pinch exercises. Person educated: Patient Education method: Explanation, Demonstration, and Verbal cues Education comprehension: verbalized understanding, returned demonstration, verbal cues required, and needs further education  HOME EXERCISE PROGRAM:   Continue to assess ongoing need for HEPs, and provide/upgrade as indicated.   -Lateral grip, and 3pt. Pinch strengthening with blue, and green resistive foam.  GOALS: Goals reviewed with patient? Yes  SHORT TERM GOALS: Target date: 02/16/2025    Pt. Will be independent with HEPs for the bilateral UEs.  Baseline: Eval: No current HEP Goal status: INITIAL   LONG TERM GOALS: Target date: 03/30/2025  Pt. Will identify and implement 3 joint protection principals for ADL/IADLs Baseline: Eval: education to be provided  Goal status: INITIAL  2.   Pt . Will improve MAM-20 score by 2 points for improved ADL/IADL performance. Baseline: Eval: 73/80 Goal status: INITIAL  3.  Pt. Will identify 3  adaptive techniques for ADL/IADL functioning.  Baseline: Eval: education to be provided  Goal status: INITIAL  ASSESSMENT:  CLINICAL IMPRESSION:  Pt. continues to tolerate paraffin bath, STM, and stretches well today with decreased stiffness noted the 2nd digit DIP. Pt. continues to work with the HEP for hand strengthening using yellow level resistive Theraputty. Pt. continues to benefit from OT services to provide education about, and review joint protection principals, adaptive strategies, and HEPs in order to increase engagement of her bilateral hands during daily ADLs, and IADL functioning.   PERFORMANCE DEFICITS: in functional skills including ADLs, IADLs, ROM, strength, pain, and UE functional use, and psychosocial skills including environmental adaptation, interpersonal interactions, and routines and behaviors.   IMPAIRMENTS: are limiting patient from ADLs, rest and sleep, work, and leisure.   COMORBIDITIES: may have co-morbidities  that affects occupational performance. Patient will benefit from skilled OT to address above impairments and improve overall function.  MODIFICATION OR ASSISTANCE TO COMPLETE EVALUATION: Min-Moderate modification of tasks or assist with assess necessary to complete an evaluation.  OT OCCUPATIONAL PROFILE AND HISTORY: Detailed assessment: Review of records and additional review of physical, cognitive, psychosocial history related to current functional performance.  CLINICAL DECISION  MAKING: Moderate - several treatment options, min-mod task modification necessary  REHAB POTENTIAL: Good  EVALUATION COMPLEXITY: Moderate      PLAN:  OT FREQUENCY: 1x/week  OT DURATION: 4 weeks  PLANNED INTERVENTIONS: 97168 OT Re-evaluation, 97535 self care/ADL training, 02889 therapeutic exercise, 97530 therapeutic activity, 97140 manual therapy, 97010 moist heat, 97034 contrast bath, 97760 Orthotic Initial, and 97763 Orthotic/Prosthetic subsequent, Paraffin  Bath  RECOMMENDED OTHER SERVICES: N/A  CONSULTED AND AGREED WITH PLAN OF CARE: Patient  PLAN FOR NEXT SESSION: Treatment  Richardson Otter, MS, OTR/L  01/21/2025, 5:23 PM   "

## 2025-01-26 ENCOUNTER — Ambulatory Visit: Admitting: Occupational Therapy

## 2025-01-28 ENCOUNTER — Ambulatory Visit: Admitting: Occupational Therapy

## 2025-02-03 ENCOUNTER — Ambulatory Visit: Admitting: Occupational Therapy

## 2025-02-05 ENCOUNTER — Ambulatory Visit: Admitting: Occupational Therapy

## 2025-02-09 ENCOUNTER — Ambulatory Visit: Admitting: Occupational Therapy

## 2025-02-11 ENCOUNTER — Ambulatory Visit: Admitting: Occupational Therapy

## 2025-02-13 ENCOUNTER — Ambulatory Visit: Admitting: Internal Medicine

## 2025-02-16 ENCOUNTER — Ambulatory Visit: Admitting: Occupational Therapy

## 2025-02-18 ENCOUNTER — Ambulatory Visit: Admitting: Occupational Therapy

## 2025-02-23 ENCOUNTER — Ambulatory Visit: Admitting: Occupational Therapy

## 2025-02-25 ENCOUNTER — Ambulatory Visit: Admitting: Occupational Therapy

## 2025-03-02 ENCOUNTER — Ambulatory Visit: Admitting: Occupational Therapy

## 2025-03-04 ENCOUNTER — Ambulatory Visit: Admitting: Occupational Therapy

## 2025-03-09 ENCOUNTER — Ambulatory Visit: Admitting: Occupational Therapy

## 2025-03-11 ENCOUNTER — Ambulatory Visit: Admitting: Occupational Therapy

## 2025-03-16 ENCOUNTER — Ambulatory Visit: Admitting: Occupational Therapy

## 2025-03-18 ENCOUNTER — Ambulatory Visit: Admitting: Occupational Therapy

## 2025-03-23 ENCOUNTER — Ambulatory Visit: Admitting: Occupational Therapy

## 2025-03-25 ENCOUNTER — Ambulatory Visit: Admitting: Occupational Therapy

## 2025-03-30 ENCOUNTER — Ambulatory Visit: Admitting: Occupational Therapy

## 2025-04-01 ENCOUNTER — Ambulatory Visit: Admitting: Occupational Therapy

## 2025-04-06 ENCOUNTER — Ambulatory Visit: Admitting: Occupational Therapy

## 2025-04-08 ENCOUNTER — Ambulatory Visit: Admitting: Occupational Therapy
